# Patient Record
Sex: Female | Born: 1990 | Race: Black or African American | Hispanic: No | Marital: Single | Smoking: Current some day smoker
Health system: Southern US, Community
[De-identification: ages and names within clinical notes are randomized; demographics above are authoritative.]

## PROBLEM LIST (undated history)

## (undated) DIAGNOSIS — D649 Anemia, unspecified: Secondary | ICD-10-CM

---

## 2016-03-26 ENCOUNTER — Emergency Department (HOSPITAL_COMMUNITY): Payer: Self-pay

## 2016-03-26 ENCOUNTER — Emergency Department (HOSPITAL_COMMUNITY)
Admission: EM | Admit: 2016-03-26 | Discharge: 2016-03-26 | Disposition: A | Discharge status: Home / Self Care | Payer: Self-pay | Attending: Physician Assistant | Admitting: Physician Assistant

## 2016-03-26 ENCOUNTER — Encounter (HOSPITAL_COMMUNITY): Payer: Self-pay | Admitting: *Deleted

## 2016-03-26 DIAGNOSIS — F17290 Nicotine dependence, other tobacco product, uncomplicated: Secondary | ICD-10-CM | POA: Insufficient documentation

## 2016-03-26 DIAGNOSIS — R0789 Other chest pain: Secondary | ICD-10-CM | POA: Insufficient documentation

## 2016-03-26 HISTORY — DX: Anemia, unspecified: D64.9

## 2016-03-26 LAB — I-STAT TROPONIN, ED: TROPONIN I, POC: 0 ng/mL (ref 0.00–0.08)

## 2016-03-26 LAB — CBC
HCT: 33.8 % — ABNORMAL LOW (ref 36.0–46.0)
Hemoglobin: 10.2 g/dL — ABNORMAL LOW (ref 12.0–15.0)
MCH: 23.6 pg — ABNORMAL LOW (ref 26.0–34.0)
MCHC: 30.2 g/dL (ref 30.0–36.0)
MCV: 78.1 fL (ref 78.0–100.0)
PLATELETS: 329 10*3/uL (ref 150–400)
RBC: 4.33 MIL/uL (ref 3.87–5.11)
RDW: 15.9 % — AB (ref 11.5–15.5)
WBC: 5.5 10*3/uL (ref 4.0–10.5)

## 2016-03-26 LAB — BASIC METABOLIC PANEL
Anion gap: 8 (ref 5–15)
BUN: 7 mg/dL (ref 6–20)
CHLORIDE: 106 mmol/L (ref 101–111)
CO2: 26 mmol/L (ref 22–32)
CREATININE: 0.55 mg/dL (ref 0.44–1.00)
Calcium: 9.7 mg/dL (ref 8.9–10.3)
Glucose, Bld: 100 mg/dL — ABNORMAL HIGH (ref 65–99)
Potassium: 4.6 mmol/L (ref 3.5–5.1)
SODIUM: 140 mmol/L (ref 135–145)

## 2016-03-26 LAB — I-STAT BETA HCG BLOOD, ED (MC, WL, AP ONLY)

## 2016-03-26 MED ORDER — KETOROLAC TROMETHAMINE 60 MG/2ML IM SOLN
60.0000 mg | Freq: Once | INTRAMUSCULAR | Status: AC
  Administered 2016-03-26: 60 mg via INTRAMUSCULAR
  Filled 2016-03-26: qty 2

## 2016-03-26 MED ORDER — IBUPROFEN 800 MG PO TABS: 800.0000 mg | ORAL_TABLET | Freq: Three times a day (TID) | ORAL | 0 refills | Status: DC | PRN

## 2016-03-26 MED ORDER — KETOROLAC TROMETHAMINE 30 MG/ML IJ SOLN: 30.0000 mg | Freq: Once | INTRAMUSCULAR | Status: DC

## 2016-03-26 NOTE — ED Triage Notes (Signed)
Pt reports mid chest pains x 2 week. ekg done and no acute distress noted at triage.

## 2016-03-26 NOTE — ED Provider Notes (Signed)
[facility]EM[facility]ENCY DEPT Provider Note   CSN: [id number] Arrival date & time: 03/26/16  0945     History   Chief Complaint Chief Complaint  Patient presents with  . Chest Pain    HPI Amber Guerra S Krotzer is a 25 y.o. female.  HPI Patient presents to the emergency department with chest pain that started about 2 and after 3 weeks ago.  The patient states she has sharp chest pain that starts by her left sternum and goes underneath her left breast around to her left side.  Patient states that certain positions and movement and palpation make the pain worse.  She states she did not take any medications prior to arrival.  States nothing seemed to make her condition betterThe patient denies chest pain, shortness of breath, headache,blurred vision, neck pain, fever, cough, weakness, numbness, dizziness, anorexia, edema, abdominal pain, nausea, vomiting, diarrhea, rash, back pain, dysuria, hematemesis, bloody stool, near syncope, or syncope. The patient denies shortness of breath, headache,blurred vision, neck pain, fever, cough, weakness, numbness, dizziness, anorexia, edema, abdominal pain, nausea, vomiting, diarrhea, rash, back pain, dysuria, hematemesis, bloody stool, near syncope, or syncope. Past Medical History:  Diagnosis Date  . Anemia     There are no active problems to display for this patient.   History reviewed. No pertinent surgical history.  OB History    No data available       Home Medications    Prior to Admission medications   Not on File    Family History History reviewed. No pertinent family history.  Social History Social History  Substance Use Topics  . Smoking status: Current Some Day Smoker    Types: Cigars  . Smokeless tobacco: Not on file  . Alcohol use No     Allergies   Review of patient's allergies indicates no known allergies.   Review of Systems Review of Systems  All other systems negative except as documented in the HPI. All pertinent  positives and negatives as reviewed in the HPI. Physical Exam Updated Vital Signs BP 117/68   Pulse 63   Temp 98.6 F (37 C)   Resp 16   LMP 03/19/2016   SpO2 98%   Physical Exam  Constitutional: She is oriented to person, place, and time. She appears well-developed and well-nourished. No distress.  HENT:  Head: Normocephalic and atraumatic.  Mouth/Throat: Oropharynx is clear and moist.  Eyes: Pupils are equal, round, and reactive to light.  Neck: Normal range of motion. Neck supple.  Cardiovascular: Normal rate, regular rhythm and normal heart sounds.  Exam reveals no gallop and no friction rub.   No murmur heard. Pulmonary/Chest: Effort normal and breath sounds normal. No respiratory distress. She has no wheezes. She exhibits tenderness.  Abdominal: Soft. Bowel sounds are normal. She exhibits no distension. There is no tenderness.  Neurological: She is alert and oriented to person, place, and time. She exhibits normal muscle tone. Coordination normal.  Skin: Skin is warm and dry. No rash noted. No erythema.  Psychiatric: She has a normal mood and affect. Her behavior is normal.  Nursing note and vitals reviewed.    ED Treatments / Results  Labs (all labs ordered are listed, but only abnormal results are displayed) Labs Reviewed  BASIC METABOLIC PANEL - Abnormal; Notable for the following:       Result Value   Glucose, Bld 100 (*)    All other components within normal limits  CBC - Abnormal; Notable for the following:    Hemoglobin  10.2 (*)    HCT 33.8 (*)    MCH 23.6 (*)    RDW 15.9 (*)    All other components within normal limits  I-STAT TROPOININ, ED  I-STAT BETA HCG BLOOD, ED ([facility], [facility], [facility] ONLY)    EKG  EKG Interpretation None       Radiology Dg Chest 2 View  Result Date: 03/26/2016 CLINICAL DATA:  Chest pain EXAM: CHEST  2 VIEW COMPARISON:  Shortness of breath FINDINGS: The heart size and mediastinal contours are within normal limits. Both lungs are clear.  The visualized skeletal structures are unremarkable. IMPRESSION: No active cardiopulmonary disease. Electronically Signed   By: Elige Ko   On: 03/26/2016 10:45    Procedures Procedures (including critical care time)  Medications Ordered in ED Medications  ketorolac (TORADOL) injection 60 mg (60 mg Intramuscular Given 03/26/16 1319)     Initial Impression / Assessment and Plan / ED Course  I have reviewed the triage vital signs and the nursing notes.  Pertinent labs & imaging results that were available during my care of the patient were reviewed by me and considered in my medical decision making (see chart for details).  Clinical Course  Patient has pain consistent with chest wall irritation.  The patient is advised to return here as needed.  One troponin was drawn due to the fact the pain has been ongoing for almost 3 weeks.  Patient is advised of plan and all questions were answered  Final Clinical Impressions(s) / ED Diagnoses   Final diagnoses:  None    New Prescriptions New Prescriptions   No medications on file     Charlestine Night, PA-C 03/26/16 1400    Courteney Randall An, MD 03/29/16 [phone]

## 2016-03-26 NOTE — Discharge Instructions (Signed)
Return here as needed.  Follow-up with a primary care doctor or urgent care. Your testing here today did not show any signs or heart related issues or infections

## 2016-08-30 ENCOUNTER — Ambulatory Visit (HOSPITAL_COMMUNITY)
Admission: EM | Admit: 2016-08-30 | Discharge: 2016-08-30 | Disposition: A | Discharge status: Home / Self Care | Payer: Self-pay | Attending: Emergency Medicine | Admitting: Emergency Medicine

## 2016-08-30 ENCOUNTER — Encounter (HOSPITAL_COMMUNITY): Payer: Self-pay | Admitting: Emergency Medicine

## 2016-08-30 DIAGNOSIS — R51 Headache: Secondary | ICD-10-CM

## 2016-08-30 DIAGNOSIS — R519 Headache, unspecified: Secondary | ICD-10-CM

## 2016-08-30 DIAGNOSIS — B349 Viral infection, unspecified: Secondary | ICD-10-CM

## 2016-08-30 MED ORDER — DEXAMETHASONE 6 MG PO TABS: 6.0000 mg | ORAL_TABLET | Freq: Two times a day (BID) | ORAL | 0 refills | Status: DC

## 2016-08-30 MED ORDER — LOPERAMIDE HCL 2 MG PO CAPS: 2.0000 mg | ORAL_CAPSULE | Freq: Four times a day (QID) | ORAL | 0 refills | Status: DC | PRN

## 2016-08-30 MED ORDER — ONDANSETRON 4 MG PO TBDP: 4.0000 mg | ORAL_TABLET | Freq: Three times a day (TID) | ORAL | 0 refills | Status: DC | PRN

## 2016-08-30 MED ORDER — KETOROLAC TROMETHAMINE 10 MG PO TABS: 10.0000 mg | ORAL_TABLET | Freq: Four times a day (QID) | ORAL | 0 refills | Status: DC | PRN

## 2016-08-30 NOTE — ED Provider Notes (Signed)
CSN: [id number]     Arrival date & time 08/30/16  1757 History   First MD Initiated Contact with Patient 08/30/16 1925     Chief Complaint  Patient presents with  . Insomnia  . Generalized Body Aches   (Consider location/radiation/quality/duration/timing/severity/associated sxs/prior Treatment) 26 year old female presents to clinic for evaluation of a one week long illness. She is complaining of headache, , diarrhea, and some nausea. Symptoms started out as being flulike one week ago, with muscle aches, bodyaches, headache, fever, congestion, and cough. She states the majority of her symptoms have resolved except for headache, and for diarrhea. She has a past history of migraine, and states her current headache is similar to previous headaches. She describes having light sensitivity, and sound sensitivity, pain is described as a bilateral dull ache.  Review of systems she has no dizziness, weakness, numbness, ear pain or pressure, tinnitus, congestion, blurred vision, does have light sensitivity, denies any chest pain or tightness, no shortness of breath, wheezing, does have a cough, is nauseated, no vomiting, positive diarrhea, no abdominal pain, no dysuria, no vaginal discharge, last menstrual period 08/23/2016, denies any possibility of pregnancy, no swelling in her hands, feet, or ankles. She smokes Black and mild's daily, occasional alcoholic drink, denies any recreational narcotic use, family history reviewed and noncontributory.   The history is provided by the patient.  Insomnia     Past Medical History:  Diagnosis Date  . Anemia    History reviewed. No pertinent surgical history. No family history on file. Social History  Substance Use Topics  . Smoking status: Current Some Day Smoker    Types: Cigars  . Smokeless tobacco: Current User  . Alcohol use No   OB History    No data available     Review of Systems  Reason unable to perform ROS: as covered in HPI.   Psychiatric/Behavioral: The patient has insomnia.   All other systems reviewed and are negative.   Allergies  Patient has no known allergies.  Home Medications   Prior to Admission medications   Medication Sig Start Date End Date Taking? Authorizing Provider  dexamethasone (DECADRON) 6 MG tablet Take 1 tablet (6 mg total) by mouth 2 (two) times daily with a meal. 08/30/16   Dorena Bodo, NP  ketorolac (TORADOL) 10 MG tablet Take 1 tablet (10 mg total) by mouth every 6 (six) hours as needed. 08/30/16   Dorena Bodo, NP  loperamide (IMODIUM) 2 MG capsule Take 1 capsule (2 mg total) by mouth 4 (four) times daily as needed for diarrhea or loose stools. 08/30/16   Dorena Bodo, NP  ondansetron (ZOFRAN ODT) 4 MG disintegrating tablet Take 1 tablet (4 mg total) by mouth every 8 (eight) hours as needed for nausea or vomiting. 08/30/16   Dorena Bodo, NP   Meds Ordered and Administered this Visit  Medications - No data to display  BP 124/78 (BP Location: Right Arm)   Pulse 73   Temp 98.2 F (36.8 C) (Oral)   Resp 17   Ht 5\' 7"  (1.702 m)   Wt 240 lb (108.9 kg)   LMP 08/23/2016   SpO2 100%   BMI 37.59 kg/m  No data found.   Physical Exam  Constitutional: She is oriented to person, place, and time. She appears well-developed and well-nourished. She does not have a sickly appearance. She does not appear ill. No distress.  HENT:  Head: Normocephalic and atraumatic.  Right Ear: Tympanic membrane and external ear normal.  Left  Ear: Tympanic membrane and external ear normal.  Nose: Nose normal. Right sinus exhibits no maxillary sinus tenderness and no frontal sinus tenderness. Left sinus exhibits no maxillary sinus tenderness and no frontal sinus tenderness.  Mouth/Throat: Uvula is midline and oropharynx is clear and moist. No oropharyngeal exudate.  Eyes: EOM are normal. Pupils are equal, round, and reactive to light.  Neck: Normal range of motion. Neck supple. No JVD present.   Cardiovascular: Normal rate and regular rhythm.   Pulmonary/Chest: Effort normal and breath sounds normal. No respiratory distress. She has no wheezes.  Abdominal: Soft. Bowel sounds are normal. She exhibits no distension. There is no tenderness. There is no guarding.  Musculoskeletal: She exhibits no edema or tenderness.  Lymphadenopathy:    She has no cervical adenopathy.  Neurological: She is alert and oriented to person, place, and time. No cranial nerve deficit.  Skin: Skin is warm and dry. Capillary refill takes less than 2 seconds. She is not diaphoretic.  Psychiatric: She has a normal mood and affect. Her behavior is normal.  Nursing note and vitals reviewed.   Urgent Care Course     Procedures (including critical care time)  Labs Review Labs Reviewed - No data to display  Imaging Review No results found.     MDM   1. Viral illness   2. Acute nonintractable headache, unspecified headache type     You most likely have a viral illness. I have prescribed loperamide for your diarrhea, take 1 tablet after each loose stool, not to exceed four tablets in any 24 hour period.   For your headache, I have prescribed Toradol, take 1 tablet every 6 hours as needed for pain. Additionally I have prescribed a steroid called Decadron, 1 tablet twice a day for 2 days. For Nausea, I have prescribed Zofran, take 1 tablet under the tongue every 8 hours as needed. Finally, take over the counter benadryl, 2 tablets or 50 mg every 6 hours not to exceed 300 mg a day.  Should your symptoms fail to resolve, follow up with primary care provider or return to clinic as needed.      Dorena BodoLawrence Ebonye Reade, NP 08/30/16 1950

## 2016-08-30 NOTE — ED Triage Notes (Signed)
Pt. Stated, I've not been able to sleep and my body has been achy for a week.

## 2016-08-30 NOTE — Discharge Instructions (Signed)
You most likely have a viral illness. I have prescribed loperamide for your diarrhea, take 1 tablet after each loose stool, not to exceed four tablets in any 24 hour period.   For your headache, I have prescribed Toradol, take 1 tablet every 6 hours as needed for pain. Additionally I have prescribed a steroid called Decadron, 1 tablet twice a day for 2 days. For Nausea, I have prescribed Zofran, take 1 tablet under the tongue every 8 hours as needed. Finally, take over the counter benadryl, 2 tablets or 50 mg every 6 hours not to exceed 300 mg a day.  Should your symptoms fail to resolve, follow up with primary care provider or return to clinic as needed.

## 2016-09-10 ENCOUNTER — Emergency Department (HOSPITAL_COMMUNITY)
Admission: EM | Admit: 2016-09-10 | Discharge: 2016-09-10 | Disposition: A | Discharge status: Home / Self Care | Payer: Self-pay | Attending: Emergency Medicine | Admitting: Emergency Medicine

## 2016-09-10 ENCOUNTER — Encounter (HOSPITAL_COMMUNITY): Payer: Self-pay | Admitting: Emergency Medicine

## 2016-09-10 DIAGNOSIS — G4700 Insomnia, unspecified: Secondary | ICD-10-CM | POA: Insufficient documentation

## 2016-09-10 DIAGNOSIS — F17290 Nicotine dependence, other tobacco product, uncomplicated: Secondary | ICD-10-CM | POA: Insufficient documentation

## 2016-09-10 DIAGNOSIS — Z79899 Other long term (current) drug therapy: Secondary | ICD-10-CM | POA: Insufficient documentation

## 2016-09-10 NOTE — ED Triage Notes (Signed)
Pt states she has been having difficulty sleeping for two weeks. Pt's hours changed at work and she is having to wake up earlier than normal. Pt has been having some diarrhea off and on as well.

## 2016-09-10 NOTE — Discharge Instructions (Signed)
Recommend to try melatonin +/- chamomile tea (goodnight time tea at grocery store).  If this doesn't work, may wish to try unisom-- this can cause some morning drowsiness so make sure you have a full night to sleep. Follow-up with the wellness clinic-- call to make appt. Return to the ED for new or worsening symptoms.

## 2016-09-10 NOTE — ED Provider Notes (Signed)
[facility] Provider Note   CSN: [id number] Arrival date & time: 09/10/16  0707     History   Chief Complaint Chief Complaint  Patient presents with  . Insomnia    HPI Amber Guerra is a 26 y.o. female.  The history is provided by the patient and medical records.    [age] year old female here with two-week history of insomnia. Patient reports her work schedule recently changed, now she goes in an hour earlier and is having difficulty going to sleep. States when she gets home in the afternoon she will sleep for about an hour or so, but awake shortly after and cannot go back to sleep until 1-2am and has to be back at work at 5 AM.  States she was seen at urgent care for likely viral illness about a week ago and was prescribed some medications, but states she did not get them filled as "she does not like taking medicine". States she did try taking some Benadryl for sleep, reports this made her drowsy but did not help her sleep at all. She has tried some Tylenol PM as well. States she has been getting frequent headaches but thinks it is because she is not able to rest. She's not had any dizziness, weakness, confusion, changes in speech, or visual disturbance. She's not had any falls or head trauma. Not currently on anticoagulation. Patient states she just feels she is getting anxious and she knows her body needs to rest. Reports she has had some intermittent diarrhea, has greatly improved from last week. No abdominal pain, nausea, or vomiting. No fever or chills.  Past Medical History:  Diagnosis Date  . Anemia     There are no active problems to display for this patient.   History reviewed. No pertinent surgical history.  OB History    No data available       Home Medications    Prior to Admission medications   Medication Sig Start Date End Date Taking? Authorizing Provider  dexamethasone (DECADRON) 6 MG tablet Take 1 tablet (6 mg total) by mouth 2 (two) times daily with  a meal. 08/30/16   Dorena BodoLawrence Kennard, NP  ketorolac (TORADOL) 10 MG tablet Take 1 tablet (10 mg total) by mouth every 6 (six) hours as needed. 08/30/16   Dorena BodoLawrence Kennard, NP  loperamide (IMODIUM) 2 MG capsule Take 1 capsule (2 mg total) by mouth 4 (four) times daily as needed for diarrhea or loose stools. 08/30/16   Dorena BodoLawrence Kennard, NP  ondansetron (ZOFRAN ODT) 4 MG disintegrating tablet Take 1 tablet (4 mg total) by mouth every 8 (eight) hours as needed for nausea or vomiting. 08/30/16   Dorena BodoLawrence Kennard, NP    Family History History reviewed. No pertinent family history.  Social History Social History  Substance Use Topics  . Smoking status: Current Some Day Smoker    Types: Cigars  . Smokeless tobacco: Current User  . Alcohol use No     Allergies   Patient has no known allergies.   Review of Systems Review of Systems  Constitutional:       Insomnia  All other systems reviewed and are negative.    Physical Exam Updated Vital Signs BP 125/79 (BP Location: Left Arm)   Pulse 88   Temp 98.7 F (37.1 C) (Oral)   Resp 15   LMP 09/06/2016   SpO2 100%   Physical Exam  Constitutional: She is oriented to person, place, and time. She appears well-developed and well-nourished. No distress.  HENT:  Head: Normocephalic and atraumatic.  Right Ear: External ear normal.  Left Ear: External ear normal.  Mouth/Throat: Oropharynx is clear and moist.  Eyes: Conjunctivae and EOM are normal. Pupils are equal, round, and reactive to light.  Neck: Normal range of motion and full passive range of motion without pain. Neck supple. No neck rigidity.  No rigidity, no meningismus  Cardiovascular: Normal rate, regular rhythm and normal heart sounds.   No murmur heard. Pulmonary/Chest: Effort normal and breath sounds normal. No respiratory distress. She has no wheezes. She has no rhonchi.  Abdominal: Soft. Bowel sounds are normal. There is no tenderness. There is no guarding.  Musculoskeletal:  Normal range of motion. She exhibits no edema.  Neurological: She is alert and oriented to person, place, and time. She has normal strength. She displays no tremor. No cranial nerve deficit or sensory deficit. She displays no seizure activity.  AAOx3, answering questions and following commands appropriately; equal strength UE and LE bilaterally; CN grossly intact; moves all extremities appropriately without ataxia; no focal neuro deficits or facial asymmetry appreciated  Skin: Skin is warm and dry. No rash noted. She is not diaphoretic.  Psychiatric: She has a normal mood and affect. Her behavior is normal. Thought content normal.  Nursing note and vitals reviewed.    ED Treatments / Results  Labs (all labs ordered are listed, but only abnormal results are displayed) Labs Reviewed - No data to display  EKG  EKG Interpretation None       Radiology No results found.  Procedures Procedures (including critical care time)  Medications Ordered in ED Medications - No data to display   Initial Impression / Assessment and Plan / ED Course  I have reviewed the triage vital signs and the nursing notes.  Pertinent labs & imaging results that were available during my care of the patient were reviewed by me and considered in my medical decision making (see chart for details).  26 year old female here with two-week history of insomnia. Reports recent schedule change at work. Viral illness last week as well. She is afebrile and nontoxic. Her exam is overall benign. She has no focal neurologic deficits. No signs or symptoms concerning for meningitis. Her abdomen is soft and nontender.  Vital signs are stable. She does admit to some increased stress which may be contributing.  Suspect her headaches may be secondary to lack of sleep and increased stress. I have offered her Toradol injection here for headache which she declined. We discussed options for natural treatment of insomnia including  melatonin and/or chamomile nighttime tea. Discussed secondary plan if these do not work including over-the-counter Unisom. She was advised of potential drowsy effects from the Unisom. She does not currently have a primary care doctor, will refer to the wellness clinic for follow-up if she continues having ongoing issues.  Discussed plan with patient, she acknowledged understanding and agreed with plan of care.  Return precautions given for new or worsening symptoms.  Final Clinical Impressions(s) / ED Diagnoses   Final diagnoses:  Insomnia, unspecified type    New Prescriptions New Prescriptions   No medications on file     Garlon Hatchet, Cordelia Poche 09/10/16 1030    Alvira Monday, MD 09/13/16 2246

## 2016-09-10 NOTE — ED Notes (Signed)
Pt states she understands instruction. Home stable with steady gait. 

## 2016-09-11 ENCOUNTER — Ambulatory Visit (INDEPENDENT_AMBULATORY_CARE_PROVIDER_SITE_OTHER): Payer: Self-pay

## 2016-09-11 ENCOUNTER — Ambulatory Visit (HOSPITAL_COMMUNITY)
Admission: EM | Admit: 2016-09-11 | Discharge: 2016-09-11 | Disposition: A | Discharge status: Home / Self Care | Payer: Self-pay | Attending: Internal Medicine | Admitting: Internal Medicine

## 2016-09-11 ENCOUNTER — Encounter (HOSPITAL_COMMUNITY): Payer: Self-pay | Admitting: Emergency Medicine

## 2016-09-11 DIAGNOSIS — M542 Cervicalgia: Secondary | ICD-10-CM

## 2016-09-11 DIAGNOSIS — M79602 Pain in left arm: Secondary | ICD-10-CM

## 2016-09-11 DIAGNOSIS — M545 Low back pain: Secondary | ICD-10-CM

## 2016-09-11 MED ORDER — KETOROLAC TROMETHAMINE 60 MG/2ML IM SOLN
60.0000 mg | Freq: Once | INTRAMUSCULAR | Status: AC
  Administered 2016-09-11: 60 mg via INTRAMUSCULAR

## 2016-09-11 MED ORDER — CYCLOBENZAPRINE HCL 10 MG PO TABS: 10.0000 mg | ORAL_TABLET | Freq: Three times a day (TID) | ORAL | 0 refills | Status: AC | PRN

## 2016-09-11 MED ORDER — KETOROLAC TROMETHAMINE 60 MG/2ML IM SOLN
INTRAMUSCULAR | Status: AC
  Filled 2016-09-11: qty 2

## 2016-09-11 MED ORDER — TRAMADOL HCL 50 MG PO TABS: 50.0000 mg | ORAL_TABLET | Freq: Four times a day (QID) | ORAL | 0 refills | Status: DC | PRN
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
## 2016-09-30 ENCOUNTER — Encounter (HOSPITAL_COMMUNITY): Payer: Self-pay | Admitting: Emergency Medicine

## 2016-09-30 ENCOUNTER — Ambulatory Visit (HOSPITAL_COMMUNITY)
Admission: EM | Admit: 2016-09-30 | Discharge: 2016-09-30 | Disposition: A | Discharge status: Home / Self Care | Payer: Self-pay | Attending: Family Medicine | Admitting: Family Medicine

## 2016-09-30 DIAGNOSIS — F5101 Primary insomnia: Secondary | ICD-10-CM

## 2016-09-30 MED ORDER — ZOLPIDEM TARTRATE 5 MG PO TABS: 5.0000 mg | ORAL_TABLET | Freq: Every evening | ORAL | 0 refills | Status: DC | PRN
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
## 2016-09-30 NOTE — ED Triage Notes (Signed)
Here for constant HA onset 3 days associated w/unable to fall asleep  Taking OTC BC powers w/no relief  A&O x4... AND

## 2016-09-30 NOTE — Discharge Instructions (Signed)
Consider taking a shower before going to bed. The medicine I prescribed only last a few hours and it's used to help get to sleep.  You may need a longer acting medicine and also works for anxiety. Once this drug is Remeron (mirtazepine)
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
## 2018-05-21 ENCOUNTER — Encounter (HOSPITAL_COMMUNITY): Payer: Self-pay | Admitting: Emergency Medicine

## 2018-05-21 ENCOUNTER — Other Ambulatory Visit: Payer: Self-pay

## 2018-05-21 ENCOUNTER — Ambulatory Visit (HOSPITAL_COMMUNITY)
Admission: EM | Admit: 2018-05-21 | Discharge: 2018-05-21 | Disposition: A | Discharge status: Home / Self Care | Payer: Self-pay | Attending: Family Medicine | Admitting: Family Medicine

## 2018-05-21 DIAGNOSIS — M25511 Pain in right shoulder: Secondary | ICD-10-CM

## 2018-05-21 DIAGNOSIS — M654 Radial styloid tenosynovitis [de Quervain]: Secondary | ICD-10-CM

## 2018-05-21 MED ORDER — PREDNISONE 50 MG PO TABS: ORAL_TABLET | ORAL | 0 refills | Status: DC

## 2018-05-21 NOTE — ED Provider Notes (Signed)
[facility]    CSN: [id number] Arrival date & time: 05/21/18  1203     History   Chief Complaint Chief Complaint  Patient presents with  . Shoulder Pain    HPI Amber Guerra is a 27 y.o. female.   Established UC patient  The patient presented to the [facility] with a complaint of right shoulder pain that started 3 days ago. The patient denied any specific injury but did report that she has been doing lifting this weekend that she does not normally do.  She first noticed discomfort in the right radial thumb and extends across the wrist joint.  She then started feeling pain in her shoulder and trapezius areas on the right.  Patient is right-hand dominant.  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     Past Medical History:  Diagnosis Date  . Anemia     There are no active problems to display for this patient.   History reviewed. No pertinent surgical history.  OB History   None      Home Medications    Prior to Admission medications   Medication Sig Start Date End Date Taking? Authorizing Provider  predniSONE (DELTASONE) 50 MG tablet One daily with food 05/21/18   Elvina Sidle, MD    Family History History reviewed. No pertinent family history.  Social History Social History   Tobacco Use  . Smoking status: Current Some Day Smoker    Types: Cigars  . Smokeless tobacco: Current User  Substance Use Topics  . Alcohol use: No  . Drug use: Yes    Types: Marijuana     Allergies   Patient has no known allergies.   Review of Systems Review of Systems   Physical Exam Triage Vital Signs ED Triage Vitals  Enc Vitals Group     BP 05/21/18 1232 128/83     Pulse Rate 05/21/18 1232 88     Resp 05/21/18 1232 18     Temp 05/21/18 1232 98.1 F (36.7 C)     Temp Source 05/21/18 1232 Oral     SpO2 05/21/18 1232 100 %     Weight --      Height --      Head Circumference --      Peak Flow --      Pain  Score 05/21/18 1230 7     Pain Loc --      Pain Edu? --      Excl. in GC? --    No data found.  Updated Vital Signs BP 128/83 (BP Location: Left Arm)   Pulse 88   Temp 98.1 F (36.7 C) (Oral)   Resp 18   LMP 04/21/2018   SpO2 100%   Visual Acuity Right Eye Distance:   Left Eye Distance:   Bilateral Distance:    Right Eye Near:   Left Eye Near:    Bilateral Near:     Physical Exam  Constitutional: She is oriented to person, place, and time. She appears well-developed and well-nourished.  HENT:  Right Ear: External ear normal.  Left Ear: External ear normal.  Eyes: Pupils are equal, round, and reactive to light. Conjunctivae are normal.  Neck: Normal range of motion. Neck supple.  Pulmonary/Chest: Effort normal.  Musculoskeletal: She exhibits tenderness.  Tender right shoulder diffusely  Negative Neer's test.  Tender right trapezius  Tender right radius and proximal right thumb  Neurological: She is alert and  oriented to person, place, and time.  Skin: Skin is warm and dry.  Psychiatric: She has a normal mood and affect.  Nursing note and vitals reviewed.    UC Treatments / Results  Labs (all labs ordered are listed, but only abnormal results are displayed) Labs Reviewed - No data to display  EKG None  Radiology No results found.  Procedures Procedures (including critical care time)  Medications Ordered in UC Medications - No data to display  Initial Impression / Assessment and Plan / UC Course  I have reviewed the triage vital signs and the nursing notes.  Pertinent labs & imaging results that were available during my care of the patient were reviewed by me and considered in my medical decision making (see chart for details).    Final Clinical Impressions(s) / UC Diagnoses   Final diagnoses:  Acute pain of right shoulder  Tenosynovitis of radial styloid     Discharge Instructions     If pain perists, return or call the hand specialist  below.    ED Prescriptions    Medication Sig Dispense Auth. Provider   predniSONE (DELTASONE) 50 MG tablet One daily with food 5 tablet Elvina SidleLauenstein, Jailyne Chieffo, MD     Controlled Substance Prescriptions [place] Controlled Substance Registry consulted? Not Applicable   Elvina SidleLauenstein, Chantele Corado, MD 05/21/18 1253

## 2018-05-21 NOTE — Discharge Instructions (Addendum)
If pain perists, return or call the hand specialist below.

## 2018-05-21 NOTE — ED Triage Notes (Signed)
[facility] a complaint of right shoulder pain that started 3 days ago. The patient denied any specific injury but did report that she has been doing lifting this weekend that she does not normally do.

## 2020-12-31 ENCOUNTER — Other Ambulatory Visit: Payer: Self-pay

## 2020-12-31 ENCOUNTER — Ambulatory Visit (HOSPITAL_COMMUNITY)
Admission: EM | Admit: 2020-12-31 | Discharge: 2020-12-31 | Disposition: A | Discharge status: Home / Self Care | Payer: Self-pay | Attending: Family Medicine | Admitting: Family Medicine

## 2020-12-31 ENCOUNTER — Encounter (HOSPITAL_COMMUNITY): Payer: Self-pay | Admitting: Emergency Medicine

## 2020-12-31 DIAGNOSIS — R079 Chest pain, unspecified: Secondary | ICD-10-CM

## 2020-12-31 DIAGNOSIS — B354 Tinea corporis: Secondary | ICD-10-CM

## 2020-12-31 DIAGNOSIS — M940 Chondrocostal junction syndrome [Tietze]: Secondary | ICD-10-CM

## 2020-12-31 MED ORDER — NAPROXEN 500 MG PO TABS: 500.0000 mg | ORAL_TABLET | Freq: Two times a day (BID) | ORAL | 0 refills | Status: DC | PRN

## 2020-12-31 MED ORDER — KETOCONAZOLE 2 % EX CREA: 1.0000 "application " | TOPICAL_CREAM | Freq: Two times a day (BID) | CUTANEOUS | 0 refills | Status: DC | PRN

## 2020-12-31 NOTE — ED Provider Notes (Signed)
[facility]    CSN: [id number] Arrival date & time: 12/31/20  8563      History   Chief Complaint No chief complaint on file.   HPI Amber Guerra is a 30 y.o. female.   Patient presenting today with several months of ongoing, intermittent sharp chest pain that is in the sternal region and sometimes wraps around the left anterior and lateral rib region.  She denies palpitations, shortness of breath, chest tightness, injury to the area, history of cardiac issues.  Direct pressure and stress seems to make the pain worse.  Not trying anything over-the-counter for symptoms.  She also noticed the past few days a rash that is mildly itchy between her breasts.  No new products used or outdoor exposures that she is aware of.  Not trying anything over-the-counter for symptoms.   Past Medical History:  Diagnosis Date   Anemia     There are no problems to display for this patient.   History reviewed. No pertinent surgical history.  OB History   No obstetric history on file.      Home Medications    Prior to Admission medications   Medication Sig Start Date End Date Taking? Authorizing Provider  ketoconazole (NIZORAL) 2 % cream Apply 1 application topically 2 (two) times daily as needed for irritation. 12/31/20  Yes Particia Nearing, PA-C  naproxen (NAPROSYN) 500 MG tablet Take 1 tablet (500 mg total) by mouth 2 (two) times daily as needed. 12/31/20  Yes Particia Nearing, PA-C  predniSONE (DELTASONE) 50 MG tablet One daily with food Patient not taking: Reported on 12/31/2020 05/21/18   Elvina Sidle, MD    Family History History reviewed. No pertinent family history.  Social History Social History   Tobacco Use   Smoking status: Some Days    Pack years: 0.00    Types: Cigars   Smokeless tobacco: Never  Vaping Use   Vaping Use: Former  Substance Use Topics   Alcohol use: No   Drug use: Yes    Types: Marijuana     Allergies   Patient has no  known allergies.   Review of Systems Review of Systems Per HPI  Physical Exam Triage Vital Signs ED Triage Vitals  Enc Vitals Group     BP 12/31/20 1014 (!) 155/96     Pulse Rate 12/31/20 1014 73     Resp 12/31/20 1014 20     Temp 12/31/20 1014 99 F (37.2 C)     Temp Source 12/31/20 1014 Oral     SpO2 12/31/20 1014 100 %     Weight --      Height --      Head Circumference --      Peak Flow --      Pain Score 12/31/20 1009 6     Pain Loc --      Pain Edu? --      Excl. in GC? --    No data found.  Updated Vital Signs BP (!) 155/96 (BP Location: Right Arm)   Pulse 73   Temp 99 F (37.2 C) (Oral)   Resp 20   LMP 12/22/2020   SpO2 100%   Visual Acuity Right Eye Distance:   Left Eye Distance:   Bilateral Distance:    Right Eye Near:   Left Eye Near:    Bilateral Near:     Physical Exam Vitals and nursing note reviewed.  Constitutional:      Appearance:  Normal appearance. She is not ill-appearing.  HENT:     Head: Atraumatic.  Eyes:     Extraocular Movements: Extraocular movements intact.     Conjunctiva/sclera: Conjunctivae normal.  Cardiovascular:     Rate and Rhythm: Normal rate and regular rhythm.     Heart sounds: Normal heart sounds.  Pulmonary:     Effort: Pulmonary effort is normal.     Breath sounds: Normal breath sounds.     Comments: Chest wall tenderness palpation reproducing the pain that she is feeling when palpating the sternal region and anterior left rib region Chest:     Chest wall: Tenderness present.  Musculoskeletal:        General: Normal range of motion.     Cervical back: Normal range of motion and neck supple.  Skin:    General: Skin is warm and dry.     Findings: Rash present.     Comments: Raised, mildly hyperpigmented peeling rash between breasts near base of sternum  Neurological:     Mental Status: She is alert and oriented to person, place, and time.  Psychiatric:        Mood and Affect: Mood normal.        Thought  Content: Thought content normal.        Judgment: Judgment normal.     UC Treatments / Results  Labs (all labs ordered are listed, but only abnormal results are displayed) Labs Reviewed - No data to display  EKG   Radiology No results found.  Procedures Procedures (including critical care time)  Medications Ordered in UC Medications - No data to display  Initial Impression / Assessment and Plan / UC Course  I have reviewed the triage vital signs and the nursing notes.  Pertinent labs & imaging results that were available during my care of the patient were reviewed by me and considered in my medical decision making (see chart for details).     Mildly hypertensive but otherwise vital signs very reassuring and within normal limits today.  EKG showing sinus bradycardia but otherwise no ST or T wave changes.  Exam overall reassuring and patient appears in no distress.  Her chest pain is reproducible, chronic, intermittent-suspect costochondritis/musculoskeletal in nature.  Will trial naproxen, heat, lidocaine patches and follow-up with primary care for recheck.  Suspect her rash on her chest is fungal, will treat with ketoconazole cream, moisturizers, tea tree oil and again follow-up with PCP if not fully resolving for recheck.  Return for acutely worsening symptoms at any time.  Final Clinical Impressions(s) / UC Diagnoses   Final diagnoses:  Tinea corporis  Chest pain, unspecified type  Costochondritis   Discharge Instructions   None    ED Prescriptions     Medication Sig Dispense Auth. Provider   naproxen (NAPROSYN) 500 MG tablet Take 1 tablet (500 mg total) by mouth 2 (two) times daily as needed. 30 tablet Particia Nearing, [place]   ketoconazole (NIZORAL) 2 % cream Apply 1 application topically 2 (two) times daily as needed for irritation. 30 g Particia Nearing, [place]      PDMP not reviewed this encounter.   Particia Nearing, [place] 12/31/20 1217

## 2020-12-31 NOTE — ED Notes (Signed)
EKG equipment unavailable

## 2020-12-31 NOTE — ED Triage Notes (Signed)
Intermittent chest pain started a couple of months ago.   Discolored skin on center chest for 2 days.

## 2022-03-10 ENCOUNTER — Encounter (HOSPITAL_COMMUNITY): Payer: Self-pay

## 2022-03-10 ENCOUNTER — Ambulatory Visit (HOSPITAL_COMMUNITY)
Admission: EM | Admit: 2022-03-10 | Discharge: 2022-03-10 | Disposition: A | Discharge status: Home / Self Care | Payer: Self-pay | Attending: Family | Admitting: Family

## 2022-03-10 DIAGNOSIS — R519 Headache, unspecified: Secondary | ICD-10-CM

## 2022-03-10 DIAGNOSIS — H5711 Ocular pain, right eye: Secondary | ICD-10-CM

## 2022-03-10 DIAGNOSIS — H53143 Visual discomfort, bilateral: Secondary | ICD-10-CM

## 2022-03-10 MED ORDER — NAPROXEN 500 MG PO TABS: 500.0000 mg | ORAL_TABLET | Freq: Two times a day (BID) | ORAL | 0 refills | Status: DC | PRN

## 2022-03-10 MED ORDER — KETOROLAC TROMETHAMINE 30 MG/ML IJ SOLN
30.0000 mg | Freq: Once | INTRAMUSCULAR | Status: AC
  Administered 2022-03-10: 30 mg via INTRAMUSCULAR

## 2022-03-10 MED ORDER — KETOROLAC TROMETHAMINE 30 MG/ML IJ SOLN
INTRAMUSCULAR | Status: AC
  Filled 2022-03-10: qty 1

## 2022-03-10 NOTE — ED Triage Notes (Signed)
Patient with c/o right eye pain more behind the eye. States she is also having headaches and light sensitivity x2 weeks.

## 2022-03-10 NOTE — ED Provider Notes (Signed)
[place]    CSN: [id number] Arrival date & time: 03/10/22  0941      History   Chief Complaint Chief Complaint  Patient presents with   Eye Pain    HPI Amber Guerra is a 31 y.o. female.   31 year old female accompanied by her significant other presents with pain behind her right eye, then headache and photophobia for the past 2 weeks. Pain is fairly constant. Pounding pain on right side. Denies any fever, runny nose, sore throat, dizziness, nausea or vomiting. No distinct trauma to eye/head.  Having difficulty sleeping due to pain. Has used OTC clear eye eyedrops with no relief.  Has taken Advil with some relief.  No previous similar episodes.  Patient does not currently wear glasses or contacts.  However patient previously was prescribed glasses and eyedrops by Eye doctor.  No other chronic health issues except anemia.  Does smoke tobacco and use marijuana occasionally. Takes no daily medication.  The history is provided by the patient.   Past Medical History:  Diagnosis Date   Anemia     There are no problems to display for this patient.   History reviewed. No pertinent surgical history.  OB History   No obstetric history on file.      Home Medications    Prior to Admission medications   Medication Sig Start Date End Date Taking? Authorizing Provider  naproxen (NAPROSYN) 500 MG tablet Take 1 tablet (500 mg total) by mouth every 12 (twelve) hours as needed for moderate pain or headache. 03/10/22  Yes Treva Huyett, Nicholes Stairs, NP    Family History No family history on file.  Social History Social History   Tobacco Use   Smoking status: Some Days    Types: Cigars   Smokeless tobacco: Never  Vaping Use   Vaping Use: Former  Substance Use Topics   Alcohol use: No   Drug use: Yes    Types: Marijuana     Allergies   Patient has no known allergies.   Review of Systems Review of Systems  Constitutional:  Positive for fatigue. Negative for  appetite change, chills, diaphoresis and fever.  HENT:  Negative for congestion, ear pain, hearing loss, mouth sores, nosebleeds, postnasal drip, rhinorrhea, sinus pressure, sinus pain, sore throat, tinnitus and trouble swallowing.   Eyes:  Positive for photophobia and pain (behind right eye only). Negative for discharge, redness and itching.  Respiratory:  Negative for cough, chest tightness and shortness of breath.   Gastrointestinal:  Negative for nausea and vomiting.  Musculoskeletal:  Negative for arthralgias, myalgias, neck pain and neck stiffness.  Skin:  Negative for color change and rash.  Allergic/Immunologic: Negative for environmental allergies, food allergies and immunocompromised state.  Neurological:  Positive for light-headedness and headaches. Negative for dizziness, tremors, seizures, syncope, facial asymmetry, speech difficulty, weakness and numbness.  Hematological:  Negative for adenopathy. Does not bruise/bleed easily.  Psychiatric/Behavioral:  Positive for sleep disturbance.      Physical Exam Triage Vital Signs ED Triage Vitals  Enc Vitals Group     BP 03/10/22 1101 (!) 136/90     Pulse Rate 03/10/22 1101 61     Resp 03/10/22 1101 18     Temp 03/10/22 1101 98.8 F (37.1 C)     Temp Source 03/10/22 1101 Oral     SpO2 03/10/22 1101 100 %     Weight --      Height --      Head Circumference --  Peak Flow --      Pain Score 03/10/22 1103 6     Pain Loc --      Pain Edu? --      Excl. in [place]? --    No data found.  Updated Vital Signs BP (!) 136/90 (BP Location: Right Arm)   Pulse 61   Temp 98.8 F (37.1 C) (Oral)   Resp 18   LMP 03/06/2022 (Approximate)   SpO2 100%   Visual Acuity Right Eye Distance: 20/15 Left Eye Distance: 20/15 Bilateral Distance: 20/15  Right Eye Near:   Left Eye Near:    Bilateral Near:     Physical Exam Vitals and nursing note reviewed.  Constitutional:      General: She is awake. She is not in acute distress.     Appearance: She is well-developed and well-groomed.     Comments: She is sitting comfortably on the exam table in no acute distress but appears tired and in pain.   HENT:     Head: Normocephalic and atraumatic. No masses, right periorbital erythema or left periorbital erythema. Hair is normal.     Jaw: There is normal jaw occlusion.      Comments: Slightly tender along right eye orbit and right frontal and temporal area. No distinct swelling, rash or redness.     Right Ear: Hearing, tympanic membrane, ear canal and external ear normal.     Left Ear: Hearing, tympanic membrane, ear canal and external ear normal.     Nose: Nose normal.     Right Sinus: No maxillary sinus tenderness.     Left Sinus: No maxillary sinus tenderness.     Mouth/Throat:     Lips: Pink.     Mouth: Mucous membranes are moist.     Pharynx: Oropharynx is clear. Uvula midline.  Eyes:     General: Lids are normal. Vision grossly intact. Gaze aligned appropriately. No visual field deficit or scleral icterus.       Right eye: No discharge.        Left eye: No discharge.     Extraocular Movements: Extraocular movements intact.     Conjunctiva/sclera: Conjunctivae normal.     Pupils: Pupils are equal, round, and reactive to light.     Comments: Patient sensitive to light both eyes R>L.  Vision checked in exam room (around 10 feet)- did notice decrease in vision of left eye compared to right.    Neck:     Vascular: No carotid bruit.  Cardiovascular:     Rate and Rhythm: Normal rate and regular rhythm.     Heart sounds: Normal heart sounds. No murmur heard. Pulmonary:     Effort: Pulmonary effort is normal.     Breath sounds: Normal breath sounds.  Musculoskeletal:        General: Normal range of motion.     Cervical back: Normal range of motion and neck supple. No rigidity or tenderness.  Lymphadenopathy:     Cervical: No cervical adenopathy.  Skin:    General: Skin is warm and dry.     Capillary Refill:  Capillary refill takes less than 2 seconds.     Findings: No rash.  Neurological:     General: No focal deficit present.     Mental Status: She is alert and oriented to person, place, and time.     Cranial Nerves: Cranial nerves 2-12 are intact. No facial asymmetry.     Sensory: Sensation is intact. No sensory deficit.  Motor: Motor function is intact.     Coordination: Coordination is intact.     Gait: Gait is intact.     Deep Tendon Reflexes: Reflexes are normal and symmetric.  Psychiatric:        Mood and Affect: Mood normal.        Behavior: Behavior normal. Behavior is cooperative.        Thought Content: Thought content normal.        Judgment: Judgment normal.      UC Treatments / Results  Labs (all labs ordered are listed, but only abnormal results are displayed) Labs Reviewed - No data to display  EKG   Radiology No results found.  Procedures Procedures (including critical care time)  Medications Ordered in UC Medications  ketorolac (TORADOL) 30 MG/ML injection 30 mg (30 mg Intramuscular Given 03/10/22 1204)    Initial Impression / Assessment and Plan / UC Course  I have reviewed the triage vital signs and the nursing notes.  Pertinent labs & imaging results that were available during my care of the patient were reviewed by me and considered in my medical decision making (see chart for details).     Discussed with patient and significant other that vision was different between both eyes in exam room but no difference was seen when then asked to do regular vision check at 20 feet. Still discussed that left eye appears slightly weaker and right eye may be trying to compensate which could be contributing to pain and headaches. Also discussed that her symptoms are fairly typical of migraine headache. May need further evaluation. No distinct red flags or need for emergent evaluation.  Will give Toradol 30mg  IM (patient nervous about needles and wanted lower dose)  to help with eye pain and headache. May try Naproxen 500mg  every 12 hours as needed for pain- take with food (patient has sensitive of dye of pills- will request capsules if available).  Reviewed that she should still see an Eye Doctor for further evaluation of vision and for a more thorough eye exam- patient plans to schedule appointment today. If eye pain or headache gets worse with any vomiting or change in vision, go to the ER ASAP. Otherwise, follow-up with the Eye Doctor as planned.  Final Clinical Impressions(s) / UC Diagnoses   Final diagnoses:  Eye pain, right  Acute intractable headache, unspecified headache type  Photophobia of both eyes     Discharge Instructions      You were given a shot of Toradol today to help with pain and inflammation. Recommend start Naproxen 500mg  every 12 hours as needed for pain- take with food. When we first tested your vision, your vision is better with your right eye than left which may be causing your some strain on your right eye and causing pain. Even though your vision appears to be the same for both eyes on the eye chart, I would still recommend that you see an Eye Doctor as soon as possible for further evaluation. If pain gets worse and vision gets worse, go to the ER ASAP. Otherwise, follow-up with the Eye doctor as planned.      ED Prescriptions     Medication Sig Dispense Auth. Provider   naproxen (NAPROSYN) 500 MG tablet Take 1 tablet (500 mg total) by mouth every 12 (twelve) hours as needed for moderate pain or headache. 30 tablet Shivon Hackel, Nicholes Stairs, NP      PDMP not reviewed this encounter.   Katy Apo,  NP 03/11/22 1144

## 2022-03-10 NOTE — Discharge Instructions (Addendum)
You were given a shot of Toradol today to help with pain and inflammation. Recommend start Naproxen 500mg  every 12 hours as needed for pain- take with food. When we first tested your vision, your vision is better with your right eye than left which may be causing your some strain on your right eye and causing pain. Even though your vision appears to be the same for both eyes on the eye chart, I would still recommend that you see an Eye Doctor as soon as possible for further evaluation. If pain gets worse and vision gets worse, go to the ER ASAP. Otherwise, follow-up with the Eye doctor as planned.

## 2022-03-22 ENCOUNTER — Ambulatory Visit (INDEPENDENT_AMBULATORY_CARE_PROVIDER_SITE_OTHER): Payer: Self-pay | Admitting: Family Medicine

## 2022-03-22 ENCOUNTER — Encounter: Payer: Self-pay | Admitting: Family Medicine

## 2022-03-22 ENCOUNTER — Telehealth: Payer: Self-pay

## 2022-03-22 VITALS — BP 134/82 | HR 88 | Temp 99.0°F | Ht 67.0 in | Wt 230.0 lb

## 2022-03-22 DIAGNOSIS — Z124 Encounter for screening for malignant neoplasm of cervix: Secondary | ICD-10-CM

## 2022-03-22 DIAGNOSIS — L918 Other hypertrophic disorders of the skin: Secondary | ICD-10-CM | POA: Insufficient documentation

## 2022-03-22 DIAGNOSIS — R5383 Other fatigue: Secondary | ICD-10-CM

## 2022-03-22 DIAGNOSIS — J3489 Other specified disorders of nose and nasal sinuses: Secondary | ICD-10-CM | POA: Insufficient documentation

## 2022-03-22 DIAGNOSIS — D649 Anemia, unspecified: Secondary | ICD-10-CM | POA: Insufficient documentation

## 2022-03-22 DIAGNOSIS — H543 Unqualified visual loss, both eyes: Secondary | ICD-10-CM

## 2022-03-22 LAB — VITAMIN B12: Vitamin B-12: 562 pg/mL (ref 211–911)

## 2022-03-22 LAB — CBC WITH DIFFERENTIAL/PLATELET
Basophils Absolute: 0.1 10*3/uL (ref 0.0–0.1)
Basophils Relative: 2 % (ref 0.0–3.0)
Eosinophils Absolute: 0.2 10*3/uL (ref 0.0–0.7)
Eosinophils Relative: 3.1 % (ref 0.0–5.0)
HCT: 22.1 % — CL (ref 36.0–46.0)
Hemoglobin: 6.7 g/dL — CL (ref 12.0–15.0)
Lymphocytes Relative: 38.7 % (ref 12.0–46.0)
Lymphs Abs: 2.6 10*3/uL (ref 0.7–4.0)
MCHC: 30.3 g/dL (ref 30.0–36.0)
MCV: 60.1 fl — ABNORMAL LOW (ref 78.0–100.0)
Monocytes Absolute: 0.5 10*3/uL (ref 0.1–1.0)
Monocytes Relative: 7.2 % (ref 3.0–12.0)
Neutro Abs: 3.3 10*3/uL (ref 1.4–7.7)
Neutrophils Relative %: 49 % (ref 43.0–77.0)
Platelets: 292 10*3/uL (ref 150.0–400.0)
RBC: 3.68 Mil/uL — ABNORMAL LOW (ref 3.87–5.11)
RDW: 19.8 % — ABNORMAL HIGH (ref 11.5–15.5)
WBC: 6.7 10*3/uL (ref 4.0–10.5)

## 2022-03-22 LAB — COMPREHENSIVE METABOLIC PANEL
ALT: 15 U/L (ref 0–35)
AST: 18 U/L (ref 0–37)
Albumin: 4.2 g/dL (ref 3.5–5.2)
Alkaline Phosphatase: 92 U/L (ref 39–117)
BUN: 12 mg/dL (ref 6–23)
CO2: 24 mEq/L (ref 19–32)
Calcium: 9 mg/dL (ref 8.4–10.5)
Chloride: 104 mEq/L (ref 96–112)
Creatinine, Ser: 0.7 mg/dL (ref 0.40–1.20)
GFR: 117.05 mL/min (ref >60.00)
Glucose, Bld: 86 mg/dL (ref 70–99)
Potassium: 3.6 mEq/L (ref 3.5–5.1)
Sodium: 135 mEq/L (ref 135–145)
Total Bilirubin: 0.3 mg/dL (ref 0.2–1.2)
Total Protein: 7.6 g/dL (ref 6.0–8.3)

## 2022-03-22 LAB — FOLATE: Folate: 7.2 ng/mL (ref >5.9)

## 2022-03-22 NOTE — Telephone Encounter (Signed)
Called pt and informed her to go to ED per PCP as she will need a blood transfusion. Pt verbalized understanding and states she will go there now.

## 2022-03-22 NOTE — Assessment & Plan Note (Signed)
Referral to OB/GYN for her first screening pap smear.

## 2022-03-22 NOTE — Telephone Encounter (Signed)
CRITICAL VALUE STICKER  CRITICAL VALUE: Hemoglobin 6.7   Hematocrit 22.1  RECEIVER (on-site recipient of call): Jarrett Soho  DATE & TIME NOTIFIED: 03/22/2022 at 4:47 pm  MESSENGER (representative from lab): Earnest Bailey  MD NOTIFIED: Harland Dingwall  TIME OF NOTIFICATION: 4:48 pm

## 2022-03-22 NOTE — Patient Instructions (Signed)
Please go down to the lab on the first floor before you leave.   I referred you to an OB/GYN and they should call you to schedule an appointment.  Let me know if you do not hear in a couple of weeks.  Schedule a visit to see Dr. Ronnald Ramp for the skin tag in your nose.  We will be in touch with your lab results.  Follow-up here as needed.

## 2022-03-22 NOTE — Assessment & Plan Note (Signed)
Check labs. May be due to anemia.

## 2022-03-22 NOTE — Progress Notes (Signed)
New Patient Office Visit  Subjective    Patient ID: Amber Guerra, female    DOB: [birth year]  Age: 31 y.o. MRN: [id number]  CC:  Chief Complaint  Patient presents with   Chest Pain   Eye Pain    When she turns her right eye to the right     HPI Amber Guerra presents to establish care  She was having chest pain with vaping. She was seen in the ED for chest pain on 12/31/2021 and diagnosed with costochondritis. naproxen No longer has chest pain or eye pain which she was evaluated in the ED for over the previous few weeks.  States her vision is getting worse and plans to see an eye doctor. No blurred or double vision.   C/o fatigue. States she eats ice. Hx of anemia. Denies fever, chills, dizziness, palpitations, shortness of breath, abdominal pain, N/V/D.   Requests referral to OB/GYN   LMP: 2 weeks ago Regular cycles. States she does not bleed heavily and it lasts 3 days typically.  Never had a pap smear.  Female partner   States she has a skin tag or mole in her left nare and would like to have it removed. Non tender.   Smokes Black and Milds.  Marijuana everyday.   Occasional alcohol use  Works as a [employer] Medications as of 03/22/2022  Medication Sig   naproxen (NAPROSYN) 500 MG tablet Take 1 tablet (500 mg total) by mouth every 12 (twelve) hours as needed for moderate pain or headache.   No facility-administered encounter medications on file as of 03/22/2022.    Past Medical History:  Diagnosis Date   Anemia     History reviewed. No pertinent surgical history.  Family History  Problem Relation Age of Onset   Diabetes Brother    Aneurysm Paternal Grandmother     Social History   Socioeconomic History   Marital status: Single    Spouse name: Not on file   Number of children: Not on file   Years of education: Not on file   Highest education level: Not on file  Occupational History   Not on file  Tobacco Use   Smoking  status: Some Days    Types: Cigars   Smokeless tobacco: Never  Vaping Use   Vaping Use: Former  Substance and Sexual Activity   Alcohol use: Yes    Comment: social   Drug use: Yes    Types: Marijuana   Sexual activity: Yes    Birth control/protection: None  Other Topics Concern   Not on file  Social History Narrative   Not on file   Social Determinants of Health   Financial Resource Strain: Not on file  Food Insecurity: Not on file  Transportation Needs: Not on file  Physical Activity: Not on file  Stress: Not on file  Social Connections: Not on file  Intimate Partner Violence: Not on file    ROS      Objective    BP 134/82   Pulse 88   Temp 99 F (37.2 C) (Oral)   Ht 5\' 7"  (1.702 m)   Wt 230 lb (104.3 kg)   LMP 03/06/2022 (Approximate)   SpO2 98%   BMI 36.02 kg/m   Physical Exam Constitutional:      General: She is not in acute distress.    Appearance: She is not ill-appearing.  HENT:     Nose:     Comments:  Wart like lesion in the opening of left nare Eyes:     Extraocular Movements: Extraocular movements intact.     Pupils: Pupils are equal, round, and reactive to light.  Cardiovascular:     Rate and Rhythm: Normal rate and regular rhythm.  Pulmonary:     Breath sounds: Normal breath sounds.  Musculoskeletal:     Cervical back: Normal range of motion and neck supple.  Skin:    General: Skin is warm and dry.  Neurological:     General: No focal deficit present.     Mental Status: She is alert and oriented to person, place, and time.  Psychiatric:        Mood and Affect: Mood normal.        Behavior: Behavior normal.         Assessment & Plan:   Problem List Items Addressed This Visit       Other   Anemia - Primary    Fatigue. Check labs including CBC, B12, folate and iron studies and follow up.       Relevant Orders   CBC with Differential/Platelet (Completed)   Comprehensive metabolic panel (Completed)   Vitamin B12  (Completed)   Iron, TIBC and Ferritin Panel   Folate (Completed)   Decreased vision in both eyes    Schedule with eye doctor for exam      Fatigue    Check labs. May be due to anemia.       Lesion of nose    Return for procedure visit with Dr. Yetta Barre. Dr. Yetta Barre is aware and will remove lesion       Pap smear for cervical cancer screening    Referral to OB/GYN for her first screening pap smear.       Relevant Orders   Ambulatory referral to Obstetrics / Gynecology    Return for Needs procedure appointment with Dr. Yetta Barre.   Hetty Blend, NP-C

## 2022-03-22 NOTE — Assessment & Plan Note (Signed)
Return for procedure visit with Dr. Ronnald Ramp. Dr. Ronnald Ramp is aware and will remove lesion

## 2022-03-22 NOTE — Assessment & Plan Note (Signed)
Schedule with eye doctor for exam

## 2022-03-22 NOTE — Assessment & Plan Note (Signed)
Fatigue. Check labs including CBC, B12, folate and iron studies and follow up.

## 2022-03-23 LAB — IRON,TIBC AND FERRITIN PANEL
%SAT: 1 % (calc) — ABNORMAL LOW (ref 16–45)
Ferritin: 3 ng/mL — ABNORMAL LOW (ref 16–154)
Iron: <10 ug/dL — ABNORMAL LOW (ref 40–190)
TIBC: 519 mcg/dL (calc) — ABNORMAL HIGH (ref 250–450)

## 2022-03-23 NOTE — Telephone Encounter (Signed)
Patient's spouse called back and said that she would tell patient that she needed to go, but she did not go yesterday because she didn't feel comfortable about going.

## 2022-03-23 NOTE — Progress Notes (Signed)
I attempted to call patient to let her know that I will order an iron infusion for her and to hopefully keep her from having to go to the emergency department. She did not answer and no voice mail set up. Please try to reach her again and I will order the iron infusion.

## 2022-03-23 NOTE — Progress Notes (Signed)
Her iron is severely low. Please find out if she was seen somewhere and what they found out. Did she get blood? Thanks.

## 2022-04-20 ENCOUNTER — Telehealth: Payer: Self-pay | Admitting: Family Medicine

## 2022-04-20 NOTE — Telephone Encounter (Signed)
Noted in last office visit: Return for procedure visit with Dr. Ronnald Ramp. Dr. Ronnald Ramp is aware and will remove lesion. Called pt and scheduled

## 2022-04-20 NOTE — Telephone Encounter (Signed)
Pt called to cancel appt 04/22/22 with PCP. Pt states she was to see a different provider in-house to have skin tag on nose removed. Informed pt her appt 04/22/22 was with PCP, not a different provider. Pt request return call to advise . Pt phone [phone]

## 2022-04-22 ENCOUNTER — Ambulatory Visit: Payer: Medicaid Other | Admitting: Family Medicine

## 2022-05-31 ENCOUNTER — Ambulatory Visit: Payer: Medicaid Other | Admitting: Internal Medicine

## 2022-07-12 ENCOUNTER — Ambulatory Visit: Payer: Medicaid Other | Admitting: Internal Medicine

## 2022-09-01 ENCOUNTER — Encounter: Payer: Medicaid Other | Admitting: Obstetrics and Gynecology

## 2022-09-01 NOTE — Progress Notes (Deleted)
   ANNUAL EXAM Patient name: Amber Guerra MRN [id number]  Date of birth: 1990/11/13 Chief Complaint:   No chief complaint on file.  History of Present Illness:   Amber Guerra is a 32 y.o. No obstetric history on file. being seen today for a routine annual exam.  Current complaints:   Menstrual concerns? {yes/no:20286}  regular, lasting 3 days Breast or nipple changes? {yes/no:20286}  Contraception use? {yes/no:20286}  Sexually active? {yes/no:20286}   No LMP recorded.   The pregnancy intention screening data noted above was reviewed. Potential methods of contraception were discussed. The patient elected to proceed with No data recorded.   Last pap none prior Last mammogram: n/a Last colonoscopy: n/a     03/22/2022    3:50 PM  Depression screen PHQ 2/9  Decreased Interest 1  Down, Depressed, Hopeless 1  PHQ - 2 Score 2  Altered sleeping 0  Tired, decreased energy 0  Change in appetite 0  Feeling bad or failure about yourself  0  Trouble concentrating 0  Moving slowly or fidgety/restless 0  Suicidal thoughts 0  PHQ-9 Score 2  Difficult doing work/chores Not difficult at all         No data to display           Review of Systems:   Pertinent items are noted in HPI Denies any headaches, blurred vision, fatigue, shortness of breath, chest pain, abdominal pain, abnormal vaginal discharge/itching/odor/irritation, problems with periods, bowel movements, urination, or intercourse unless otherwise stated above. Pertinent History Reviewed:  Reviewed past medical,surgical, social and family history.  Reviewed problem list, medications and allergies. Physical Assessment:  There were no vitals filed for this visit.There is no height or weight on file to calculate BMI.        Physical Examination:   General appearance - well appearing, and in no distress  Mental status - alert, oriented to person, place, and time  Psych:  She has a normal mood and affect  Skin -  warm and dry, normal color, no suspicious lesions noted  Chest - effort normal, all lung fields clear to auscultation bilaterally  Heart - normal rate and regular rhythm  Breasts - breasts appear normal, no suspicious masses, no skin or nipple changes or  axillary nodes  Abdomen - soft, nontender, nondistended, no masses or organomegaly  Pelvic -  VULVA: normal appearing vulva with no masses, tenderness or lesions   VAGINA: normal appearing vagina with normal color and discharge, no lesions   CERVIX: normal appearing cervix without discharge or lesions, no CMT  Thin prep pap is {Desc; done/not:10129} *** HR HPV cotesting  UTERUS: uterus is felt to be normal size, shape, consistency and nontender   ADNEXA: No adnexal masses or tenderness noted.  Extremities:  No swelling or varicosities noted  Chaperone present for exam  No results found for this or any previous visit (from the past 24 hour(s)).    Assessment & Plan:  There are no diagnoses linked to this encounter.  Labs/procedures today: ***  Mammogram: {Mammo f/u:25212::"@ 30yo"}, or sooner if problems Colonoscopy: {TCS f/u:25213::"@ 30yo"}, or sooner if problems  No orders of the defined types were placed in this encounter.   Meds: No orders of the defined types were placed in this encounter.   Follow-up: No follow-ups on file.  Darliss Cheney, MD 09/01/2022 10:01 AM

## 2022-09-09 ENCOUNTER — Ambulatory Visit
Admission: EM | Admit: 2022-09-09 | Discharge: 2022-09-09 | Disposition: A | Discharge status: Home / Self Care | Payer: Medicaid Other | Attending: Internal Medicine | Admitting: Internal Medicine

## 2022-09-09 DIAGNOSIS — K0889 Other specified disorders of teeth and supporting structures: Secondary | ICD-10-CM | POA: Diagnosis not present

## 2022-09-09 DIAGNOSIS — K047 Periapical abscess without sinus: Secondary | ICD-10-CM | POA: Diagnosis not present

## 2022-09-09 MED ORDER — AMOXICILLIN-POT CLAVULANATE 875-125 MG PO TABS: 1.0000 | ORAL_TABLET | Freq: Two times a day (BID) | ORAL | 0 refills | Status: DC

## 2022-09-09 MED ORDER — NAPROXEN 500 MG PO TABS: 500.0000 mg | ORAL_TABLET | Freq: Two times a day (BID) | ORAL | 0 refills | Status: AC | PRN

## 2022-09-09 NOTE — ED Triage Notes (Signed)
Triage completed by provider.

## 2022-09-09 NOTE — ED Provider Notes (Signed)
[name]    CSN: [id number] Arrival date & time: 09/09/22  0802      History   Chief Complaint No chief complaint on file.   HPI Amber Guerra is a 32 y.o. female.   Patient presents to urgent care for evaluation of left upper tooth pain that started approximately 1 week ago.  She has a broken tooth to the area of tenderness and has been placing dental packing in the area to reduce pain associated with cold sensitivity and pain with eating.  She states this has helped slightly but she remains in significant pain.  States she feels as though the left side of her mouth is beginning to swell.  She has not had any fever, chills, nausea, vomiting, abdominal pain, sore throat, recent dental work, chest pain, heart palpitations, ear pain, or headache.  No tongue swelling or sensation of throat closure.  No recent antibiotic or steroid use.  Has an appointment with her dentist on September 29, 2022 (3 weeks from now).  Has been using 200 mg of ibuprofen to help with pain without much relief.  Last dose was this morning.     Past Medical History:  Diagnosis Date   Anemia     Patient Active Problem List   Diagnosis Date Noted   Anemia 03/22/2022   Pap smear for cervical cancer screening 03/22/2022   Decreased vision in both eyes 03/22/2022   Lesion of nose 03/22/2022   Fatigue 03/22/2022    No past surgical history on file.  OB History   No obstetric history on file.      Home Medications    Prior to Admission medications   Medication Sig Start Date End Date Taking? Authorizing Provider  amoxicillin-clavulanate (AUGMENTIN) 875-125 MG tablet Take 1 tablet by mouth every 12 (twelve) hours. 09/09/22  Yes Talbot Grumbling, FNP  naproxen (NAPROSYN) 500 MG tablet Take 1 tablet (500 mg total) by mouth every 12 (twelve) hours as needed for moderate pain or headache. 09/09/22   Talbot Grumbling, FNP    Family History Family History  Problem Relation Age of  Onset   Diabetes Brother    Aneurysm Paternal Grandmother     Social History Social History   Tobacco Use   Smoking status: Some Days    Types: Cigars   Smokeless tobacco: Never  Vaping Use   Vaping Use: Former  Substance Use Topics   Alcohol use: Yes    Comment: social   Drug use: Yes    Types: Marijuana     Allergies   Patient has no known allergies.   Review of Systems Review of Systems Per HPI  Physical Exam Triage Vital Signs ED Triage Vitals  Enc Vitals Group     BP 09/09/22 0834 135/85     Pulse Rate 09/09/22 0834 70     Resp 09/09/22 0834 15     Temp 09/09/22 0834 98.1 F (36.7 C)     Temp Source 09/09/22 0834 Oral     SpO2 09/09/22 0834 97 %     Weight --      Height --      Head Circumference --      Peak Flow --      Pain Score 09/09/22 0917 2     Pain Loc --      Pain Edu? --      Excl. in [place]? --    No data found.  Updated Vital Signs BP  135/85 (BP Location: Right Arm)   Pulse 70   Temp 98.1 F (36.7 C) (Oral)   Resp 15   SpO2 97%   Visual Acuity Right Eye Distance:   Left Eye Distance:   Bilateral Distance:    Right Eye Near:   Left Eye Near:    Bilateral Near:     Physical Exam Vitals and nursing note reviewed.  Constitutional:      Appearance: She is not ill-appearing or toxic-appearing.  HENT:     Head: Normocephalic and atraumatic.     Right Ear: Hearing, tympanic membrane, ear canal and external ear normal.     Left Ear: Hearing, tympanic membrane, ear canal and external ear normal.     Nose: Nose normal.     Mouth/Throat:     Lips: Pink.     Mouth: Mucous membranes are moist. No injury.     Dentition: Abnormal dentition. Dental tenderness and dental caries present. No dental abscesses.     Tongue: No lesions. Tongue does not deviate from midline.     Palate: No mass and lesions.     Pharynx: Oropharynx is clear. Uvula midline. No pharyngeal swelling, oropharyngeal exudate, posterior oropharyngeal erythema or uvula  swelling.     Tonsils: No tonsillar exudate or tonsillar abscesses.      Comments: Multiple missing teeth to mouth with evidence of previous dental caries. No erythema to the overlying tissue.  Eyes:     General: Lids are normal. Vision grossly intact. Gaze aligned appropriately.     Extraocular Movements: Extraocular movements intact.     Conjunctiva/sclera: Conjunctivae normal.  Cardiovascular:     Rate and Rhythm: Normal rate and regular rhythm.     Heart sounds: Normal heart sounds, S1 normal and S2 normal.  Pulmonary:     Effort: Pulmonary effort is normal. No respiratory distress.     Breath sounds: Normal breath sounds and air entry.  Musculoskeletal:     Cervical back: Neck supple.  Lymphadenopathy:     Cervical: No cervical adenopathy.  Skin:    General: Skin is warm and dry.     Capillary Refill: Capillary refill takes less than 2 seconds.     Findings: No rash.  Neurological:     General: No focal deficit present.     Mental Status: She is alert and oriented to person, place, and time. Mental status is at baseline.     Cranial Nerves: No dysarthria or facial asymmetry.  Psychiatric:        Mood and Affect: Mood normal.        Speech: Speech normal.        Behavior: Behavior normal.        Thought Content: Thought content normal.        Judgment: Judgment normal.      UC Treatments / Results  Labs (all labs ordered are listed, but only abnormal results are displayed) Labs Reviewed - No data to display  EKG   Radiology No results found.  Procedures Procedures (including critical care time)  Medications Ordered in UC Medications - No data to display  Initial Impression / Assessment and Plan / UC Course  I have reviewed the triage vital signs and the nursing notes.  Pertinent labs & imaging results that were available during my care of the patient were reviewed by me and considered in my medical decision making (see chart for details).   1.  Dental  pain, dental infection Patient's dental pain  is likely due to developing dental infection to affected broken tooth.  Augmentin sent to cover for infection.  Advised to take this as prescribed twice daily for the next 7 days with food.  Naproxen 500 mg every 12 hours as needed with food for dental inflammation and pain may be used, no other NSAIDs while taking naproxen.  May use Tylenol 1000 mg every 6 hours as needed for breakthrough pain.  Salt water gargles recommended every 3-4 hours.  Advised to keep appointment with dentist on April 10th 2024.  She is agreeable with plan.   Discussed physical exam and available lab work findings in clinic with patient.  Counseled patient regarding appropriate use of medications and potential side effects for all medications recommended or prescribed today. Discussed red flag signs and symptoms of worsening condition,when to call the PCP office, return to urgent care, and when to seek higher level of care in the emergency department. Patient verbalizes understanding and agreement with plan. All questions answered. Patient discharged in stable condition.    Final Clinical Impressions(s) / UC Diagnoses   Final diagnoses:  Pain, dental  Dental infection     Discharge Instructions      Your dental pain is likely due to dental infection. Take Augmentin antibiotic as prescribed for the next 7 days to treat your dental infection. Naproxen 500mg  every 12 hours as needed with food for dental inflammation and pain.  You may also take 1000 mg of Tylenol every 6 hours as needed with this for breakthrough pain. Perform salt water gargles every 3-4 hours.  Keep your appointment with your dentist.  If you develop any new or worsening symptoms or do not improve in the next 2 to 3 days, please return.  If your symptoms are severe, please go to the emergency room.  Follow-up with your primary care provider for further evaluation and management of your symptoms as well as  ongoing wellness visits.  I hope you feel better!   ED Prescriptions     Medication Sig Dispense Auth. Provider   amoxicillin-clavulanate (AUGMENTIN) 875-125 MG tablet Take 1 tablet by mouth every 12 (twelve) hours. 14 tablet Joella Prince M, FNP   naproxen (NAPROSYN) 500 MG tablet Take 1 tablet (500 mg total) by mouth every 12 (twelve) hours as needed for moderate pain or headache. 30 tablet Talbot Grumbling, FNP      PDMP not reviewed this encounter.   Talbot Grumbling, [place] 09/09/22 [phone]

## 2022-09-09 NOTE — Discharge Instructions (Signed)
Your dental pain is likely due to dental infection. Take Augmentin antibiotic as prescribed for the next 7 days to treat your dental infection. Naproxen 500mg  every 12 hours as needed with food for dental inflammation and pain.  You may also take 1000 mg of Tylenol every 6 hours as needed with this for breakthrough pain. Perform salt water gargles every 3-4 hours.  Keep your appointment with your dentist.  If you develop any new or worsening symptoms or do not improve in the next 2 to 3 days, please return.  If your symptoms are severe, please go to the emergency room.  Follow-up with your primary care provider for further evaluation and management of your symptoms as well as ongoing wellness visits.  I hope you feel better!

## 2022-12-01 ENCOUNTER — Ambulatory Visit
Admission: EM | Admit: 2022-12-01 | Discharge: 2022-12-01 | Disposition: A | Discharge status: Home / Self Care | Payer: Medicaid Other | Attending: Emergency Medicine | Admitting: Emergency Medicine

## 2022-12-01 DIAGNOSIS — H01005 Unspecified blepharitis left lower eyelid: Secondary | ICD-10-CM

## 2022-12-01 DIAGNOSIS — G43109 Migraine with aura, not intractable, without status migrainosus: Secondary | ICD-10-CM | POA: Diagnosis not present

## 2022-12-01 MED ORDER — RIZATRIPTAN BENZOATE 5 MG PO TABS: 5.0000 mg | ORAL_TABLET | ORAL | 0 refills | Status: AC | PRN

## 2022-12-01 MED ORDER — KETOROLAC TROMETHAMINE 30 MG/ML IJ SOLN
30.0000 mg | Freq: Once | INTRAMUSCULAR | Status: AC
  Administered 2022-12-01: 30 mg via INTRAMUSCULAR

## 2022-12-01 MED ORDER — ONDANSETRON 4 MG PO TBDP
4.0000 mg | ORAL_TABLET | Freq: Once | ORAL | Status: AC
  Administered 2022-12-01: 4 mg via ORAL

## 2022-12-01 NOTE — ED Provider Notes (Signed)
[name]    CSN: [id number] Arrival date & time: 12/01/22  0902      History   Chief Complaint Chief Complaint  Patient presents with   Eye Problem    Entered by patient    HPI FAVOR MASSOTH is a 32 y.o. female. She woke up 11/29/22 with a L sided headache and her L eyelids swollen. Her L eye hurts. She reports a tiny amount of discharge the last 2 days from inner L eye. Eye has not been crusted shut in the mornings and she has not noticed discharge during the day. Denies injury or fall. Lights bother her. Sounds and smells do not bother her. Does not feel nauseated. No change in vision. Does not feel like something is in her eye. Has been using cold compresses to her eye which has helped the swelling a little but not the eye pain or headache.   Separating out the eyelid swelling and headache that involves eye, pt reports she gets headaches just like this, on the left side, involves pain in L eye, about 2x/month. Usually takes ibuprofen for them that helps, but it is not helping this episode. Can feel when a headache is coming on. They are always in the same location. REgarding eyelid swelling, she has never had this before.     Eye Problem   Past Medical History:  Diagnosis Date   Anemia     Patient Active Problem List   Diagnosis Date Noted   Anemia 03/22/2022   Pap smear for cervical cancer screening 03/22/2022   Decreased vision in both eyes 03/22/2022   Lesion of nose 03/22/2022   Fatigue 03/22/2022    History reviewed. No pertinent surgical history.  OB History   No obstetric history on file.      Home Medications    Prior to Admission medications   Medication Sig Start Date End Date Taking? Authorizing Provider  rizatriptan (MAXALT) 5 MG tablet Take 1 tablet (5 mg total) by mouth as needed for migraine. May repeat in 2 hours if needed 12/01/22  Yes Malea Swilling, Marzella Schlein, NP  amoxicillin-clavulanate (AUGMENTIN) 875-125 MG tablet Take 1 tablet by  mouth every 12 (twelve) hours. 09/09/22   Carlisle Beers, FNP  naproxen (NAPROSYN) 500 MG tablet Take 1 tablet (500 mg total) by mouth every 12 (twelve) hours as needed for moderate pain or headache. 09/09/22   Carlisle Beers, FNP    Family History Family History  Problem Relation Age of Onset   Diabetes Brother    Aneurysm Paternal Grandmother     Social History Social History   Tobacco Use   Smoking status: Some Days    Types: Cigars   Smokeless tobacco: Never  Vaping Use   Vaping Use: Former  Substance Use Topics   Alcohol use: Yes    Comment: social   Drug use: Yes    Types: Marijuana     Allergies   Patient has no known allergies.   Review of Systems Review of Systems   Physical Exam Triage Vital Signs ED Triage Vitals  Enc Vitals Group     BP 12/01/22 0925 137/83     Pulse Rate 12/01/22 0925 70     Resp 12/01/22 0925 18     Temp 12/01/22 0925 98.1 F (36.7 C)     Temp Source 12/01/22 0925 Oral     SpO2 12/01/22 0925 98 %     Weight 12/01/22 0924 224 lb (101.6 kg)  Height 12/01/22 0924 5\' 7"  (1.702 m)     Head Circumference --      Peak Flow --      Pain Score 12/01/22 0923 8     Pain Loc --      Pain Edu? --      Excl. in [place]? --    No data found.  Updated Vital Signs BP 137/83 (BP Location: Left Arm)   Pulse 70   Temp 98.1 F (36.7 C) (Oral)   Resp 18   Ht 5\' 7"  (1.702 m)   Wt 224 lb (101.6 kg)   SpO2 98%   BMI 35.08 kg/m   Visual Acuity Right Eye Distance:   Left Eye Distance:   Bilateral Distance:    Right Eye Near:   Left Eye Near:    Bilateral Near:     Physical Exam Constitutional:      Appearance: Normal appearance. She is not ill-appearing.     Comments: Wearing sunglasses inside due to light sensitivity  Eyes:     General:        Left eye: No hordeolum.     Conjunctiva/sclera: Conjunctivae normal.     Left eye: Left conjunctiva is not injected. No chemosis or exudate.    Pupils: Pupils are equal,  round, and reactive to light.     Comments: R eye grossly normal  Pulmonary:     Effort: Pulmonary effort is normal.  Neurological:     Mental Status: She is alert and oriented to person, place, and time.     Gait: Gait normal.      UC Treatments / Results  Labs (all labs ordered are listed, but only abnormal results are displayed) Labs Reviewed - No data to display  EKG   Radiology No results found.  Procedures Procedures (including critical care time)  Medications Ordered in UC Medications  ketorolac (TORADOL) 30 MG/ML injection 30 mg (30 mg Intramuscular Given 12/01/22 0954)  ondansetron (ZOFRAN-ODT) disintegrating tablet 4 mg (4 mg Oral Given 12/01/22 0955)    Initial Impression / Assessment and Plan / UC Course  I have reviewed the triage vital signs and the nursing notes.  Pertinent labs & imaging results that were available during my care of the patient were reviewed by me and considered in my medical decision making (see chart for details).     I suspect she is having a migraine typical for her AND also has blepharitis, given her symptoms. Given toradol and zofran. Rx maxalt to use for next headache. Pt will pay attention to triggers and discuss headaches with PCP within the next month. REviewed blepharitis home care.   Final Clinical Impressions(s) / UC Diagnoses   Final diagnoses:  Blepharitis of left lower eyelid, unspecified type  Migraine with aura and without status migrainosus, not intractable     Discharge Instructions      Follow up with Ms. Henson about your headaches. Next headache, use the rizatriptan (prescription migraine medicine) as prescribed instead of ibuprofen. Let Ms. Suezanne Jacquet know if it works for your or not. Also pay attention to things that might be triggering your migraines - see attached handout for common triggers.   Use a warm washcloth at least twice a day to your left eye. Use a clean washcloth each time. Also wash your eyelashes  with tear-free baby shampoo at least once a day.    ED Prescriptions     Medication Sig Dispense Auth. Provider   rizatriptan (MAXALT) 5 MG  tablet Take 1 tablet (5 mg total) by mouth as needed for migraine. May repeat in 2 hours if needed 10 tablet Betsi Crespi, Marzella Schlein, NP      PDMP not reviewed this encounter.   Cathlyn Parsons, NP 12/01/22 1021

## 2022-12-01 NOTE — Discharge Instructions (Signed)
Follow up with Amber Guerra about your headaches. Next headache, use the rizatriptan (prescription migraine medicine) as prescribed instead of ibuprofen. Let Amber Guerra know if it works for your or not. Also pay attention to things that might be triggering your migraines - see attached handout for common triggers.   Use a warm washcloth at least twice a day to your left eye. Use a clean washcloth each time. Also wash your eyelashes with tear-free baby shampoo at least once a day.

## 2022-12-01 NOTE — ED Triage Notes (Signed)
Patient here today with c/o left eye pain and swelling since Monday. Swelling seems to be improving. She has been using frozen peas which seems to help. She took IBU which helped with the pain. She has some burry vision at times and light sensitivity. She has been having a slight headache since Monday as well.

## 2022-12-16 ENCOUNTER — Encounter: Payer: Medicaid Other | Admitting: Family Medicine

## 2023-11-18 ENCOUNTER — Ambulatory Visit
Admission: EM | Admit: 2023-11-18 | Discharge: 2023-11-18 | Disposition: A | Discharge status: Home / Self Care | Attending: Physician Assistant | Admitting: Physician Assistant

## 2023-11-18 ENCOUNTER — Other Ambulatory Visit: Payer: Self-pay

## 2023-11-18 DIAGNOSIS — H02843 Edema of right eye, unspecified eyelid: Secondary | ICD-10-CM | POA: Diagnosis not present

## 2023-11-18 DIAGNOSIS — L03213 Periorbital cellulitis: Secondary | ICD-10-CM

## 2023-11-18 DIAGNOSIS — H00011 Hordeolum externum right upper eyelid: Secondary | ICD-10-CM | POA: Diagnosis not present

## 2023-11-18 MED ORDER — CEPHALEXIN 500 MG PO CAPS: 500.0000 mg | ORAL_CAPSULE | Freq: Four times a day (QID) | ORAL | 0 refills | Status: AC

## 2023-11-18 MED ORDER — TETRACAINE HCL 0.5 % OP SOLN
1.0000 [drp] | Freq: Once | OPHTHALMIC | Status: AC
  Administered 2023-11-18: 2 [drp] via OPHTHALMIC

## 2023-11-18 NOTE — ED Triage Notes (Signed)
 Pt presents with complaints of right eye swelling x 1 day. Although, pt states her right eye has been feeling "achy" for the past three days. Pt currently rates her overall pain a 7/10. OTC eye drops applied. Tylenol and Claritin taken with some relief. Eye wash performed at home. Feels like there is something in eye. Right eye is sensitive to light and vision is blurry.

## 2023-11-18 NOTE — ED Provider Notes (Addendum)
 [facility]    CSN: [id number] Arrival date & time: 11/18/23  1002      History   Chief Complaint Chief Complaint  Patient presents with   Facial Swelling    HPI Amber Guerra is a 33 y.o. female.   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 No pertinent surgical history.  OB History   No obstetric history on file.      Home Medications    Prior to Admission medications   Medication Sig Start Date End Date Taking? Authorizing Provider  cephALEXin  (KEFLEX ) 500 MG capsule Take 1 capsule (500 mg total) by mouth 4 (four) times daily for 7 days. 11/18/23 11/25/23 Yes Rhyker Silversmith E, PA-C  naproxen  (NAPROSYN ) 500 MG tablet Take 1 tablet (500 mg total) by mouth every 12 (twelve) hours as needed for moderate pain or headache. 09/09/22   Starlene Eaton, FNP  rizatriptan  (MAXALT ) 5 MG tablet Take 1 tablet (5 mg total) by mouth as needed for migraine. May repeat in 2 hours if needed 12/01/22   Blinda Burger, NP    Family History Family History  Problem Relation Age of Onset   Diabetes Brother    Aneurysm  Paternal Grandmother     Social History Social History   Tobacco Use   Smoking status: Some Days    Types: Cigars   Smokeless tobacco: Never  Vaping Use   Vaping status: Former  Substance Use Topics   Alcohol use: Yes    Comment: social   Drug use: Yes    Types: Marijuana     Allergies   Patient has no known allergies.   Review of Systems Review of Systems  Constitutional:  Negative for chills and fever.  Eyes:  Positive for photophobia, pain and visual disturbance. Negative for discharge and redness.     Physical Exam Triage Vital Signs ED Triage Vitals  Encounter Vitals Group     BP 11/18/23 1022 134/85     Systolic BP Percentile --      Diastolic BP Percentile --      Pulse Rate 11/18/23 1022 76     Resp 11/18/23 1022 18     Temp 11/18/23 1022 98.6 F (37 C)     Temp Source 11/18/23 1022 Oral     SpO2 11/18/23 1022 98 %     Weight 11/18/23 1024 220 lb (99.8 kg)     Height 11/18/23 1024 5\' 7"  (1.702 m)     Head Circumference --  Peak Flow --      Pain Score 11/18/23 1023 7     Pain Loc --      Pain Education --      Exclude from Growth Chart --    No data found.  Updated Vital Signs BP 134/85 (BP Location: Right Arm)   Pulse 76   Temp 98.6 F (37 C) (Oral)   Resp 18   Ht 5\' 7"  (1.702 m)   Wt 220 lb (99.8 kg)   LMP 11/08/2023 (Exact Date)   SpO2 98%   BMI 34.46 kg/m   Visual Acuity Right Eye Distance:   Left Eye Distance:   Bilateral Distance:    Right Eye Near:   Left Eye Near:    Bilateral Near:     Physical Exam Vitals reviewed.  Constitutional:      General: She is awake. She is not in acute distress.    Appearance: Normal appearance. She is well-developed and well-groomed. She is not ill-appearing or toxic-appearing.  HENT:     Head: Normocephalic and atraumatic.  Eyes:     General:        Right eye: Hordeolum present. No foreign body or discharge.        Left eye: No foreign body, discharge or hordeolum.      Extraocular Movements: Extraocular movements intact.     Right eye: Normal extraocular motion and no nystagmus.     Left eye: Normal extraocular motion and no nystagmus.     Conjunctiva/sclera: Conjunctivae normal.     Right eye: Right conjunctiva is not injected. No chemosis, exudate or hemorrhage.    Left eye: Left conjunctiva is not injected. No chemosis, exudate or hemorrhage.    Pupils: Pupils are equal, round, and reactive to light.     Right eye: No corneal abrasion or fluorescein uptake.     Left eye: No corneal abrasion or fluorescein uptake.     Comments: Right eye findings Pt has pain and twitching with EOM testing. EOM appears to be intact and symmetrical but is painful per patient  She has swelling of the upper eyelid that is rather extensive. No obvious signs of inferior swelling or lower lid swelling. Globe does not appear displaced  No obvious signs of corneal abrasion or fluorescein uptake on exam  Neurological:     General: No focal deficit present.     Mental Status: She is alert and oriented to person, place, and time.  Psychiatric:        Mood and Affect: Mood normal.        Behavior: Behavior normal. Behavior is cooperative.        Thought Content: Thought content normal.        Judgment: Judgment normal.      UC Treatments / Results  Labs (all labs ordered are listed, but only abnormal results are displayed) Labs Reviewed - No data to display  EKG   Radiology No results found.  Procedures Procedures (including critical care time)  Medications Ordered in UC Medications  tetracaine (PONTOCAINE) 0.5 % ophthalmic solution 1-2 drop (has no administration in time range)    Initial Impression / Assessment and Plan / UC Course  I have reviewed the triage vital signs and the nursing notes.  Pertinent labs & imaging results that were available during my care of the patient were reviewed by me and considered in my medical decision making (see chart for  details).      Final Clinical Impressions(s) /  UC Diagnoses   Final diagnoses:  Hordeolum externum of right upper eyelid  Swelling of eyelid, right  Periorbital cellulitis of right eye   Patient presents today with concerns of right upper eyelid swelling that has been ongoing for about a day.  She reports that she has had eye pain for the past 3 days but swelling became more severe today.  She has tried Tylenol as well as eyedrops and an eye rinse but this has not provided adequate relief.  Physical exam is notable for significant right upper eyelid swelling.  She does have intact EOMs but notes that there is pain especially with lateral eye movement.  At this time I am unsure if she has an extensive hordeolum versus preseptal cellulitis so I am sending her home with Keflex 500 mg p.o. 4 times daily x 7 days.  Recommend warm compresses to the eye to assist with drainage if this is a hordeolum.  Reviewed that if symptoms are not improving or worsening over the next 1 to 2 days she should go to the emergency room for further evaluation and ongoing management.  ED and return precautions reviewed and provided after visit summary.  Follow-up as needed.    Discharge Instructions      You were seen today for concerns of swelling of your upper right eyelid.  At this time I am concerned that you may have a stye which could be causing your symptoms but given how painful this is I am concerned that you may have an infection called preseptal cellulitis.  Styes are usually treated with warm compresses at home as well as over-the-counter pain medications as needed.  Cellulitis typically needs an antibiotic to clear it so I have sent you home with a medication called Keflex.  I would like you to take this as directed 4 times per day for the next 7 days.  You can use lubricating eyedrops such as blink tears or refresh eyedrops as needed to help with any gritty sensation that may be occurring.  I do recommend  using a warm compress to the eye to help with drainage.  This is usually accomplished with a warm washcloth that has been wet with warm to hot water and applied to the eye for about 15 minutes. if you do not start to see improvement in your symptoms in the next 2 days or if it starts to get worse I recommend following up at the emergency room so that you can be seen by an ophthalmologist for appropriate care.  If at any point you start to develop difficulty moving your eye, severe vision changes, severe headaches, drainage or pus coming from the eye, vision loss please go to the emergency room immediately.   ED Prescriptions     Medication Sig Dispense Auth. Provider   cephALEXin (KEFLEX) 500 MG capsule Take 1 capsule (500 mg total) by mouth 4 (four) times daily for 7 days. 28 capsule Rick Carruthers E, PA-C      PDMP not reviewed this encounter.   Amalea Ottey, Pearla Bottom, PA-C 11/18/23 1122    Pauletta Pickney E, PA-C 11/18/23 1124

## 2023-11-18 NOTE — Discharge Instructions (Addendum)
 You were seen today for concerns of swelling of your upper right eyelid.  At this time I am concerned that you may have a stye which could be causing your symptoms but given how painful this is I am concerned that you may have an infection called preseptal cellulitis.  Styes are usually treated with warm compresses at home as well as over-the-counter pain medications as needed.  Cellulitis typically needs an antibiotic to clear it so I have sent you home with a medication called Keflex.  I would like you to take this as directed 4 times per day for the next 7 days.  You can use lubricating eyedrops such as blink tears or refresh eyedrops as needed to help with any gritty sensation that may be occurring.  I do recommend using a warm compress to the eye to help with drainage.  This is usually accomplished with a warm washcloth that has been wet with warm to hot water and applied to the eye for about 15 minutes. if you do not start to see improvement in your symptoms in the next 2 days or if it starts to get worse I recommend following up at the emergency room so that you can be seen by an ophthalmologist for appropriate care.  If at any point you start to develop difficulty moving your eye, severe vision changes, severe headaches, drainage or pus coming from the eye, vision loss please go to the emergency room immediately.

## 2024-02-08 ENCOUNTER — Encounter (HOSPITAL_COMMUNITY): Payer: Self-pay

## 2024-02-08 ENCOUNTER — Ambulatory Visit: Payer: Self-pay | Admitting: Family Medicine

## 2024-02-08 ENCOUNTER — Encounter: Payer: Self-pay | Admitting: Family Medicine

## 2024-02-08 ENCOUNTER — Ambulatory Visit (INDEPENDENT_AMBULATORY_CARE_PROVIDER_SITE_OTHER): Admitting: Family Medicine

## 2024-02-08 ENCOUNTER — Telehealth: Payer: Self-pay

## 2024-02-08 ENCOUNTER — Ambulatory Visit (HOSPITAL_BASED_OUTPATIENT_CLINIC_OR_DEPARTMENT_OTHER)
Admission: RE | Admit: 2024-02-08 | Discharge: 2024-02-08 | Disposition: A | Discharge status: Home / Self Care | Source: Ambulatory Visit | Attending: Family Medicine | Admitting: Family Medicine

## 2024-02-08 ENCOUNTER — Emergency Department (HOSPITAL_COMMUNITY)
Admission: EM | Admit: 2024-02-08 | Discharge: 2024-02-09 | Discharge status: Against Medical Advice | Attending: Emergency Medicine | Admitting: Emergency Medicine

## 2024-02-08 ENCOUNTER — Other Ambulatory Visit: Payer: Self-pay

## 2024-02-08 VITALS — BP 122/76 | HR 79 | Temp 97.6°F | Ht 67.0 in | Wt 182.0 lb

## 2024-02-08 DIAGNOSIS — K644 Residual hemorrhoidal skin tags: Secondary | ICD-10-CM | POA: Diagnosis not present

## 2024-02-08 DIAGNOSIS — R1900 Intra-abdominal and pelvic swelling, mass and lump, unspecified site: Secondary | ICD-10-CM | POA: Diagnosis not present

## 2024-02-08 DIAGNOSIS — F329 Major depressive disorder, single episode, unspecified: Secondary | ICD-10-CM

## 2024-02-08 DIAGNOSIS — D649 Anemia, unspecified: Secondary | ICD-10-CM

## 2024-02-08 DIAGNOSIS — J3489 Other specified disorders of nose and nasal sinuses: Secondary | ICD-10-CM | POA: Diagnosis not present

## 2024-02-08 DIAGNOSIS — Z5329 Procedure and treatment not carried out because of patient's decision for other reasons: Secondary | ICD-10-CM | POA: Insufficient documentation

## 2024-02-08 DIAGNOSIS — R1909 Other intra-abdominal and pelvic swelling, mass and lump: Secondary | ICD-10-CM

## 2024-02-08 DIAGNOSIS — N920 Excessive and frequent menstruation with regular cycle: Secondary | ICD-10-CM | POA: Insufficient documentation

## 2024-02-08 DIAGNOSIS — K649 Unspecified hemorrhoids: Secondary | ICD-10-CM

## 2024-02-08 DIAGNOSIS — Z124 Encounter for screening for malignant neoplasm of cervix: Secondary | ICD-10-CM

## 2024-02-08 DIAGNOSIS — R42 Dizziness and giddiness: Secondary | ICD-10-CM | POA: Diagnosis present

## 2024-02-08 DIAGNOSIS — D500 Iron deficiency anemia secondary to blood loss (chronic): Secondary | ICD-10-CM | POA: Diagnosis not present

## 2024-02-08 DIAGNOSIS — D259 Leiomyoma of uterus, unspecified: Secondary | ICD-10-CM | POA: Diagnosis not present

## 2024-02-08 LAB — CBC WITH DIFFERENTIAL/PLATELET
Abs Immature Granulocytes: 0.01 K/uL (ref 0.00–0.07)
Basophils Absolute: 0.1 K/uL (ref 0.0–0.1)
Basophils Absolute: 0 K/uL (ref 0.0–0.1)
Basophils Relative: 1.1 % (ref 0.0–3.0)
Basophils Relative: 1 %
Eosinophils Absolute: 0.2 K/uL (ref 0.0–0.7)
Eosinophils Absolute: 0.2 K/uL (ref 0.0–0.5)
Eosinophils Relative: 3.2 % (ref 0.0–5.0)
Eosinophils Relative: 4 %
HCT: 20 % — CL (ref 36.0–46.0)
HCT: 20.7 % — ABNORMAL LOW (ref 36.0–46.0)
Hemoglobin: 5.5 g/dL — CL (ref 12.0–15.0)
Hemoglobin: 5.1 g/dL — CL (ref 12.0–15.0)
Immature Granulocytes: 0 %
Lymphocytes Relative: 28.6 % (ref 12.0–46.0)
Lymphocytes Relative: 35 %
Lymphs Abs: 1.4 K/uL (ref 0.7–4.0)
Lymphs Abs: 1.5 K/uL (ref 0.7–4.0)
MCH: 14.6 pg — ABNORMAL LOW (ref 26.0–34.0)
MCHC: 27.5 g/dL — ABNORMAL LOW (ref 30.0–36.0)
MCHC: 24.6 g/dL — ABNORMAL LOW (ref 30.0–36.0)
MCV: 53.8 fl — ABNORMAL LOW (ref 78.0–100.0)
MCV: 59.1 fL — ABNORMAL LOW (ref 80.0–100.0)
Monocytes Absolute: 0.5 K/uL (ref 0.1–1.0)
Monocytes Absolute: 0.4 K/uL (ref 0.1–1.0)
Monocytes Relative: 8.9 % (ref 3.0–12.0)
Monocytes Relative: 8 %
Neutro Abs: 2.9 K/uL (ref 1.4–7.7)
Neutro Abs: 2.2 K/uL (ref 1.7–7.7)
Neutrophils Relative %: 58.2 % (ref 43.0–77.0)
Neutrophils Relative %: 52 %
Platelets: 347 K/uL (ref 150.0–400.0)
Platelets: 325 K/uL (ref 150–400)
RBC: 3.72 Mil/uL — ABNORMAL LOW (ref 3.87–5.11)
RBC: 3.5 MIL/uL — ABNORMAL LOW (ref 3.87–5.11)
RDW: 21 % — ABNORMAL HIGH (ref 11.5–15.5)
RDW: 21.6 % — ABNORMAL HIGH (ref 11.5–15.5)
WBC: 5.1 K/uL (ref 4.0–10.5)
WBC: 4.3 K/uL (ref 4.0–10.5)
nRBC: 0 % (ref 0.0–0.2)

## 2024-02-08 LAB — TSH: TSH: 1.15 u[IU]/mL (ref 0.35–5.50)

## 2024-02-08 LAB — COMPREHENSIVE METABOLIC PANEL WITH GFR
ALT: 13 U/L (ref 0–35)
ALT: 14 U/L (ref 0–44)
AST: 19 U/L (ref 0–37)
AST: 21 U/L (ref 15–41)
Albumin: 4.3 g/dL (ref 3.5–5.2)
Albumin: 3.5 g/dL (ref 3.5–5.0)
Alkaline Phosphatase: 78 U/L (ref 39–117)
Alkaline Phosphatase: 80 U/L (ref 38–126)
Anion gap: 8 (ref 5–15)
BUN: 9 mg/dL (ref 6–23)
BUN: 10 mg/dL (ref 6–20)
CO2: 24 meq/L (ref 19–32)
CO2: 22 mmol/L (ref 22–32)
Calcium: 8.9 mg/dL (ref 8.4–10.5)
Calcium: 9.2 mg/dL (ref 8.9–10.3)
Chloride: 105 meq/L (ref 96–112)
Chloride: 107 mmol/L (ref 98–111)
Creatinine, Ser: 0.58 mg/dL (ref 0.40–1.20)
Creatinine, Ser: 0.67 mg/dL (ref 0.44–1.00)
GFR, Estimated: >60 mL/min (ref >60)
GFR: 120.87 mL/min (ref >60.00)
Glucose, Bld: 96 mg/dL (ref 70–99)
Glucose, Bld: 99 mg/dL (ref 70–99)
Potassium: 3.8 meq/L (ref 3.5–5.1)
Potassium: 3.4 mmol/L — ABNORMAL LOW (ref 3.5–5.1)
Sodium: 137 meq/L (ref 135–145)
Sodium: 137 mmol/L (ref 135–145)
Total Bilirubin: 0.5 mg/dL (ref 0.2–1.2)
Total Bilirubin: 0.7 mg/dL (ref 0.0–1.2)
Total Protein: 8.2 g/dL (ref 6.0–8.3)
Total Protein: 7.7 g/dL (ref 6.5–8.1)

## 2024-02-08 LAB — FOLATE: Folate: 7.9 ng/mL (ref >5.9)

## 2024-02-08 LAB — HCG, SERUM, QUALITATIVE: Preg, Serum: NEGATIVE

## 2024-02-08 LAB — FERRITIN
Ferritin: 3.3 ng/mL — ABNORMAL LOW (ref 10.0–291.0)
Ferritin: 3 ng/mL — ABNORMAL LOW (ref 11–307)

## 2024-02-08 LAB — IRON AND TIBC
Iron: 9 ug/dL — ABNORMAL LOW (ref 28–170)
Saturation Ratios: 2 % — ABNORMAL LOW (ref 10.4–31.8)
TIBC: 592 ug/dL — ABNORMAL HIGH (ref 250–450)
UIBC: 583 ug/dL

## 2024-02-08 LAB — POC OCCULT BLOOD, ED: Fecal Occult Bld: NEGATIVE

## 2024-02-08 LAB — T4, FREE: Free T4: 0.79 ng/dL (ref 0.60–1.60)

## 2024-02-08 LAB — ABO/RH: ABO/RH(D): O POS

## 2024-02-08 LAB — VITAMIN B12: Vitamin B-12: 646 pg/mL (ref 211–911)

## 2024-02-08 LAB — PREPARE RBC (CROSSMATCH)

## 2024-02-08 MED ORDER — SODIUM CHLORIDE 0.9% IV SOLUTION: Freq: Once | INTRAVENOUS | Status: AC

## 2024-02-08 MED ORDER — IOHEXOL 300 MG/ML  SOLN
100.0000 mL | Freq: Once | INTRAMUSCULAR | Status: AC | PRN
  Administered 2024-02-08: 100 mL via INTRAVENOUS

## 2024-02-08 MED ORDER — FERROUS SULFATE 325 (65 FE) MG PO TABS: 325.0000 mg | ORAL_TABLET | Freq: Every day | ORAL | 0 refills | Status: AC

## 2024-02-08 NOTE — ED Provider Notes (Signed)
 [place] EMERGENCY DEPARTMENT AT [facility] Provider Note   CSN: [id number] Arrival date & time: 02/08/24  1645     Patient presents with: Low Hemoglobin   Amber Guerra is a 33 y.o. female.   Patient is a 33 year old female presenting after the recommendations from her PCP office for anemia.  Patient was seen for worsening fatigue, lightheadedness, dizziness, and exertional dyspnea.  Patient states the symptoms have been slowly progressive since the death of her brother in Oct 02, 2023.  She also states she chews ice frequently since this time.  She admits to a 55 pound weight loss due to satiety.  She denies any nausea or vomiting.  She denies any black or bloody stools.  She denies a family history of colon cancer.  She denies any hematemesis.  She admits to regular menstrual cycles lasting approximately 4 to 5 days, saturating medium sized pads every 4-5 hours.  First day of last menstrual cycle approximately January 16, 2024.  Denies any current vaginal bleeding.    Chart review demonstrates that patient was seen in 2023 and found to have a hemoglobin of 6.7.  She was recommended for transfusion at this time but declined and left prior to completion of workup.  Today's laboratory testing by patient's PCP demonstrates a hemoglobin of 5.5.  The history is provided by the patient. No language interpreter was used.       Prior to Admission medications   Medication Sig Start Date End Date Taking? Authorizing Provider  ferrous sulfate  325 (65 FE) MG tablet Take 1 tablet (325 mg total) by mouth daily. 02/08/24 03/09/24 Yes Elnor Hila P, DO  naproxen  (NAPROSYN ) 500 MG tablet Take 1 tablet (500 mg total) by mouth every 12 (twelve) hours as needed for moderate pain or headache. 09/09/22   Stanhope, Catharine M, FNP  rizatriptan  (MAXALT ) 5 MG tablet Take 1 tablet (5 mg total) by mouth as needed for migraine. May repeat in 2 hours if needed 12/01/22   Richad Jon HERO, NP     Allergies: Patient has no known allergies.    Review of Systems  Constitutional:  Positive for chills and fatigue. Negative for fever.  HENT:  Negative for ear pain and sore throat.   Eyes:  Negative for pain and visual disturbance.  Respiratory:  Negative for cough and shortness of breath.   Cardiovascular:  Negative for chest pain and palpitations.  Gastrointestinal:  Negative for abdominal pain and vomiting.  Genitourinary:  Negative for dysuria and hematuria.  Musculoskeletal:  Negative for arthralgias and back pain.  Skin:  Negative for color change and rash.  Neurological:  Positive for dizziness, weakness and light-headedness. Negative for seizures and syncope.  All other systems reviewed and are negative.   Updated Vital Signs BP (!) 124/91   Pulse 73   Temp 98.2 F (36.8 C) (Oral)   Resp 17   Ht 5' 7 (1.702 m)   Wt 82.5 kg   SpO2 100%   BMI 28.49 kg/m   Physical Exam Vitals and nursing note reviewed.  Constitutional:      General: She is not in acute distress.    Appearance: She is well-developed.  HENT:     Head: Normocephalic and atraumatic.  Eyes:     Conjunctiva/sclera: Conjunctivae normal.  Cardiovascular:     Rate and Rhythm: Normal rate and regular rhythm.     Heart sounds: No murmur heard. Pulmonary:     Effort: Pulmonary effort is normal. No  respiratory distress.     Breath sounds: Normal breath sounds.  Abdominal:     Palpations: Abdomen is soft.     Tenderness: There is no abdominal tenderness.  Genitourinary:    Rectum: Guaiac result negative. External hemorrhoid present. No mass, tenderness or anal fissure. Normal anal tone.  Musculoskeletal:        General: No swelling.     Cervical back: Neck supple.  Skin:    General: Skin is warm and dry.     Capillary Refill: Capillary refill takes less than 2 seconds.  Neurological:     Mental Status: She is alert.  Psychiatric:        Mood and Affect: Mood normal.     (all labs ordered  are listed, but only abnormal results are displayed) Labs Reviewed  CBC WITH DIFFERENTIAL/PLATELET - Abnormal; Notable for the following components:      Result Value   RBC 3.50 (*)    Hemoglobin 5.1 (*)    HCT 20.7 (*)    MCV 59.1 (*)    MCH 14.6 (*)    MCHC 24.6 (*)    RDW 21.6 (*)    All other components within normal limits  COMPREHENSIVE METABOLIC PANEL WITH GFR - Abnormal; Notable for the following components:   Potassium 3.4 (*)    All other components within normal limits  FERRITIN - Abnormal; Notable for the following components:   Ferritin 3 (*)    All other components within normal limits  IRON AND TIBC - Abnormal; Notable for the following components:   Iron 9 (*)    TIBC 592 (*)    Saturation Ratios 2 (*)    All other components within normal limits  POC OCCULT BLOOD, ED  TYPE AND SCREEN  ABO/RH  PREPARE RBC (CROSSMATCH)    EKG: None  Radiology: CT ABDOMEN PELVIS W CONTRAST Result Date: 02/08/2024 CLINICAL DATA:  Palpable abdominal mass. EXAM: CT ABDOMEN AND PELVIS WITH CONTRAST TECHNIQUE: Multidetector CT imaging of the abdomen and pelvis was performed using the standard protocol following bolus administration of intravenous contrast. RADIATION DOSE REDUCTION: This exam was performed according to the departmental dose-optimization program which includes automated exposure control, adjustment of the mA and/or kV according to patient size and/or use of iterative reconstruction technique. CONTRAST:  OMNIPAQUE  IOHEXOL  300 MG/ML  SOLN COMPARISON:  None Available. FINDINGS: Lower chest: The visualized lung bases are clear. No intra-abdominal free air or free fluid. Hepatobiliary: No focal liver abnormality is seen. No gallstones, gallbladder wall thickening, or biliary dilatation. Pancreas: Unremarkable. No pancreatic ductal dilatation or surrounding inflammatory changes. Spleen: Normal in size without focal abnormality. Adrenals/Urinary Tract: The adrenal glands  unremarkable. The kidneys, visualized ureters, and urinary bladder appear unremarkable. Stomach/Bowel: There is no bowel obstruction or active inflammation. The appendix is normal. Vascular/Lymphatic: The abdominal aorta and IVC are unremarkable. No portal venous gas. There is no adenopathy. Reproductive: The uterus is enlarged and myomatous. The largest fibroid measures approximately 11 x 12 x 12 cm. Dilated cystic structures in the region of the neck is a appears somewhat tubular and may represent hydrosalpinx. Ultrasound may provide better evaluation. Other: None Musculoskeletal: No acute osseous pathology. IMPRESSION: 1. No acute intra-abdominal or pelvic pathology. 2. Enlarged myomatous uterus. 3. Possible hydrosalpinx. Ultrasound may provide better evaluation. Electronically Signed   By: Vanetta Chou M.D.   On: 02/08/2024 15:39     .Critical Care  Performed by: Elnor Bernarda SQUIBB, DO Authorized by: Elnor Bernarda SQUIBB, DO  Critical care provider statement:    Critical care time (minutes):  76   Critical care was necessary to treat or prevent imminent or life-threatening deterioration of the following conditions: Anemia requiring blood transfusion.   Critical care was time spent personally by me on the following activities:  Development of treatment plan with patient or surrogate, discussions with consultants, evaluation of patient's response to treatment, examination of patient, ordering and review of laboratory studies, ordering and review of radiographic studies, ordering and performing treatments and interventions, pulse oximetry, re-evaluation of patient's condition and review of old charts    Medications Ordered in the ED  0.9 %  sodium chloride  infusion (Manually program via Guardrails IV Fluids) (0 mLs Intravenous Paused 02/08/24 2139)                                    Medical Decision Making Amount and/or Complexity of Data Reviewed Labs: ordered.  Risk Prescription drug  management.   33 year old female presenting after the recommendations from her PCP office for anemia.  On exam patient is alert oriented x 3, no acute distress, afebrile, stable vital signs.  Her abdomen is soft and nontender.  Her rectal exam demonstrates 1 nonbleeding external hemorrhoid.  No gross blood.  Hemoccult negative.  She also was ordered a CT abdomen and pelvis today by her primary care office that demonstrated concern for uterine fibroids only.  Patient does not admit to history of heavy menstrual cycles.  She is not actively bleeding or on her menstrual cycle.  Repeat lab testing today confirms a hemoglobin of 5.1.  Risk first benefits of blood transfusion described in detail to patient.  2 units packed red blood cell ordered.  Iron, TIBC, and ferritin was ordered today.  Iron 9, TIBC 592, ferritin 3.    Patient was recommended for admission for somatic anemia likely secondary to menorrhagia with a hemoglobin of 5.1 and an iron of 9.  She is recommended for 2 units of packed red blood cell transfusion and an iron transfusion.  She is recommended for follow-up with OB/GYN to initiate oral contraceptive use to help decrease vaginal bleeding.  Patient is adamant that she does not want admission at this time.  She understands that it will likely be for less than 24 to 48 hours and she is still declining.  The patient has requested to leave the ED against medical advice. I believe this patient is of sound mind and medical decision making capacity to refuse medical care. The patient is responding and asking questions appropriately. The patient is oriented to person, place and time. The patient is not psychotic, delusional, suicidal, homicidal or hallucinating. The patient demonstrates a normal mental capacity to make decisions regarding their healthcare. The patient is clinically sober and does not appear to be under the influence of any illicit drugs at this time. The patient has been advised of the  risks, in layman terms, of leaving AMA which include, but are not limited to death, coma, permanent disability, loss of current lifestyle, delay in diagnosis. Alternatives have been offered - the patient remains steadfast in their wish to leave. The patient has been advised that should they change their mind they are welcome to return to this hospital, or any other, at any time. The patient understands that in no way does an AMA discharge mean that I do not want them to have the best medical care  available. To this end, I have offered appropriate prescriptions, referrals, and discharge instructions. The patient did sign AMA paperwork. The above discussion was witnessed by another member of staff.  Patient agreed to blood transfusion prior to discharge.  Agrees to follow-up with OB/GYN.  She agrees to follow-up with her PCP.  Iron supplements ordered.  She already has an established OB/GYN of her PCP referred her to.     Final diagnoses:  Symptomatic anemia  Menorrhagia with regular cycle  Iron deficiency anemia due to chronic blood loss    ED Discharge Orders          Ordered    ferrous sulfate  325 (65 FE) MG tablet  Daily        02/08/24 2239               Elnor Bernarda SQUIBB, DO 02/08/24 2242

## 2024-02-08 NOTE — Progress Notes (Signed)
 Will you attempt to call patient again to see if she is going to the ED?

## 2024-02-08 NOTE — Telephone Encounter (Unsigned)
 Copied from CRM [id number]. Topic: General - Other >> Feb 08, 2024 12:07 PM Rosina BIRCH wrote: Reason for CRM: patient spouse returning a call to the office. I contacted [facility] and because of the long hold, the patient disconnected the call

## 2024-02-08 NOTE — Telephone Encounter (Signed)
 CRITICAL VALUE STICKER  CRITICAL VALUE: Hemoglobin 5.5 Hematocrit 20  RECEIVER (on-site recipient of call): Savera Donson  DATE & TIME NOTIFIED: 08/20 1151am  MESSENGER (representative from lab): Carmena  MD NOTIFIED: Boby Mackintosh NP  TIME OF NOTIFICATION: 1152  RESPONSE:  Patients significant other notified to take patient to the ED. Attempted to reach patient 3x, before calling partner. Partner gave verbal understanding

## 2024-02-08 NOTE — ED Triage Notes (Signed)
 Pt went to PCP today and hemoglobin was 5.5, no hx of low hemoglobin. Pt states she has been getting more fatigued than usual when exerting herself.

## 2024-02-08 NOTE — Patient Instructions (Signed)
 Please go downstairs for labs before you leave.  I ordered a CT of your abdomen and then you will receive a call to get this done in the next couple of days.  I also referred you to OB/GYN and they we will call you to schedule a visit  I referred you to dermatology for the skin tag in your nose and they will call you  I will be in touch with your results  I recommend calling and scheduling with Rolin Pouch, therapist for [place] on the first floor of our building  Use MiraLAX to keep her stools soft and continue with over-the-counter hemorrhoid cream.  Follow-up if this is worsening or not getting better

## 2024-02-08 NOTE — ED Provider Notes (Signed)
  Provider Note MRN:  [id number]  Arrival date & time: 02/08/24    ED Course and Medical Decision Making  Assumed care of patient at sign-out or upon transfer.  Heavy menstrual bleeding with significant anemia receiving blood transfusion, refusing admission, will leave AMA after blood.  Procedures  Final Clinical Impressions(s) / ED Diagnoses     ICD-10-CM   1. Symptomatic anemia  D64.9     2. Menorrhagia with regular cycle  N92.0     3. Iron deficiency anemia due to chronic blood loss  D50.0       ED Discharge Orders          Ordered    ferrous sulfate  325 (65 FE) MG tablet  Daily        02/08/24 2239            Discharge Instructions   None     Ozell HERO. Theadore, MD [facility] Emergency Medicine [facility] [email or web address]    Theadore Ozell HERO, MD 02/08/24 2352

## 2024-02-08 NOTE — Progress Notes (Signed)
 Subjective:     Patient ID: Amber Guerra, female    DOB: [birth year], 33 y.o.   MRN: [id number]  Chief Complaint  Patient presents with   Annual Exam    fasting    HPI  Discussed the use of AI scribe software for clinical note transcription with the patient, who gave verbal consent to proceed.  History of Present Illness Amber Guerra is a 33 year old female who presents with several concerns including depression, grief, skin lesion, rectal pain, abdominal mass. Her girlfriend is with her.   Menstrual abnormalities and anemia - Regular menstrual cycles with heavy flow during the first couple of days - Switched from tampons to pads, resulting in improved menstrual cramps - History of anemia  Nasal skin tag - Persistent skin tag inside the nose, previously discussed in 2023 - Remains bothersome and has not been removed  Perianal lesion - Abnormality in the perianal region, similar to a hemorrhoid, appeared three weeks ago - Initially painful and pruritic, especially during bowel movements - Difficulty sitting due to discomfort - Preparation H provided some relief; lidocaine caused discomfort - Lesion is now smaller and less painful - No rectal bleeding  Abdominal mass and discomfort - Palpable knot in the abdomen causing discomfort when lying on her side - Occasionally necessitates using the bathroom  Lower extremity discoloration - Discoloration present on one leg compared to the other for the past few days  Appetite and weight loss - Significant weight loss following her brother's death in 2023/09/23 - Poor appetite, often eating only once daily   Tobacco use - Smokes cigarettes - Considering reducing tobacco use  Mental health and coping - Considering therapy and has researched therapists - Works as a Interior and spatial designer, 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 No pertinent surgical history.  Family History  Problem Relation Age of Onset   Diabetes Brother    Aneurysm Paternal Grandmother     Social History   Socioeconomic History   Marital status: Single    Spouse name: Not on file   Number of children: Not on file   Years of education: Not on file   Highest education level: Not on file  Occupational History   Not on file  Tobacco Use   Smoking status: Some Days    Types: Cigars   Smokeless tobacco: Never  Vaping Use   Vaping status: Former  Substance and Sexual Activity   Alcohol use: Yes    Comment: social   Drug use: Yes    Types: Marijuana   Sexual activity: Yes    Birth control/protection: None  Other Topics Concern   Not on file  Social History Narrative   Not on file   Social Drivers of Health   Financial Resource Strain: Not on file  Food Insecurity: Not on file  Transportation Needs: Not on file  Physical Activity: Not on file  Stress: Not on file  Social Connections: Not on  file  Intimate Partner Violence: Not on file    Outpatient Medications Prior to Visit  Medication Sig Dispense Refill   naproxen  (NAPROSYN ) 500 MG tablet Take 1 tablet (500 mg total) by mouth every 12 (twelve) hours as needed for moderate pain or headache. 30 tablet 0   rizatriptan  (MAXALT ) 5 MG tablet Take 1 tablet (5 mg total) by mouth as needed for migraine. May repeat in 2 hours if needed 10 tablet 0   No facility-administered medications prior to visit.    No Known Allergies  Review of Systems  Constitutional:  Positive for malaise/fatigue and weight loss. Negative for chills and fever.  Respiratory:  Negative  for shortness of breath.   Cardiovascular:  Negative for chest pain, palpitations and leg swelling.  Gastrointestinal:  Positive for abdominal pain and constipation. Negative for diarrhea, nausea and vomiting.       Hemorrhoid, rectal itching  Hard, tender mass of lower abdomen   Genitourinary:  Negative for dysuria, frequency and urgency.  Neurological:  Negative for dizziness, focal weakness and headaches.  Psychiatric/Behavioral:  Positive for depression. Negative for suicidal ideas. The patient has insomnia.        Objective:    Physical Exam Constitutional:      General: She is not in acute distress.    Appearance: She is not ill-appearing.  HENT:     Mouth/Throat:     Mouth: Mucous membranes are moist.     Pharynx: Oropharynx is clear.  Eyes:     Extraocular Movements: Extraocular movements intact.     Conjunctiva/sclera: Conjunctivae normal.     Pupils: Pupils are equal, round, and reactive to light.  Cardiovascular:     Rate and Rhythm: Normal rate and regular rhythm.  Pulmonary:     Effort: Pulmonary effort is normal.     Breath sounds: Normal breath sounds.  Abdominal:     General: Bowel sounds are normal.     Palpations: There is mass.     Tenderness: There is abdominal tenderness. There is no guarding or rebound.      Comments: Large, tender mass of lower abdomen/pelvis  Musculoskeletal:     Cervical back: Normal range of motion and neck supple. No tenderness.     Right lower leg: No edema.     Left lower leg: No edema.  Lymphadenopathy:     Cervical: No cervical adenopathy.  Skin:    General: Skin is warm and dry.  Neurological:     General: No focal deficit present.     Mental Status: She is alert and oriented to person, place, and time.  Psychiatric:        Mood and Affect: Mood normal.        Behavior: Behavior normal.        Thought Content: Thought content normal.      BP 122/76   Pulse 79   Temp 97.6 F (36.4 C) (Temporal)   Ht 5' 7  (1.702 m)   Wt 182 lb (82.6 kg)   SpO2 96%   BMI 28.51 kg/m  Wt Readings from Last 3 Encounters:  02/08/24 182 lb (82.6 kg)  11/18/23 220 lb (99.8 kg)  12/01/22 224 lb (101.6 kg)       Assessment & Plan:   Problem List Items Addressed This Visit     Anemia   Relevant Orders   Ferritin   Folate   Vitamin B12   TSH   T4, free   Comprehensive metabolic  panel with GFR   CBC with Differential/Platelet   Lesion of nose   Relevant Orders   Ambulatory referral to Dermatology   Other Visit Diagnoses       Abdominal mass of other site    -  Primary   Relevant Orders   Ambulatory referral to Obstetrics / Gynecology   Ferritin   Folate   CT ABDOMEN PELVIS W CONTRAST   Comprehensive metabolic panel with GFR   CBC with Differential/Platelet   hCG, serum, qualitative     Reactive depression       Relevant Orders   TSH   T4, free     Screening for cervical cancer       Relevant Orders   Ambulatory referral to Obstetrics / Gynecology     Hemorrhoids, unspecified hemorrhoid type          Assessment and Plan Assessment & Plan Suspected uterine fibroid with abdominal mass and tenderness Abdominal mass with tenderness, suspected to be a uterine fibroid. The mass is hard and has been present for unknown time. Reports a knot in the lower abdomen causing discomfort when lying on that side and sometimes necessitating a bowel movement. - Order CT scan to evaluate the abdominal mass. - Perform blood work to check kidney function prior to CT scan. - Conduct urine pregnancy test to rule out pregnancy. However, she denies having female sexual partner or being pregnant.  - Refer to gynecologist for further evaluation and management.  Hemorrhoids Presence of hemorrhoid causing discomfort and difficulty sitting. Symptoms include firmness and tenderness, with no bleeding reported. The hemorrhoid is decreasing in size. Has been using Preparation H and lidocaine, but lidocaine caused  discomfort. Declines rectal exam.  - Continue application of Preparation H cream. - Advise warm baths for symptom relief. - Recommend stool softeners and increased hydration to prevent aggravation. - Use Miralax to maintain soft stools. - Follow up if symptoms do not improve.  Skin tag of nasal cavity Skin tag located inside the nasal cavity, causing discomfort. Desires removal. - Refer to dermatology for removal of the nasal skin tag.  Anemia Anemia with regular menstrual cycles. Periods are heavy for the first couple of days. Recent change from tampons to pads has improved cramping. Reports leg discoloration, which will be evaluated with future blood work. - Plan to perform blood work at a future preventive health visit to evaluate anemia.  Grief reaction Experiencing grief reaction following the loss of her brother. No current counseling or medication for depression. Reports poor sleep and decreased appetite. Considering therapy as a first step. Hesitant about medication and prefers to try therapy first. - Encourage scheduling an appointment with a therapist, such as Amber Guerra. - Discuss potential for medication if therapy alone is insufficient. - Monitor mental health and consider further intervention if needed.    I am having Amber Guerra maintain her naproxen  and rizatriptan .  No orders of the defined types were placed in this encounter.

## 2024-02-08 NOTE — Telephone Encounter (Signed)
 Attempted to call patient once more, was able to reach her. Advised she go straight to the ED, gave verbal understanding

## 2024-02-09 ENCOUNTER — Encounter: Payer: Self-pay | Admitting: Family Medicine

## 2024-02-09 ENCOUNTER — Telehealth: Payer: Self-pay | Admitting: Family Medicine

## 2024-02-09 NOTE — ED Notes (Signed)
 Opened chart to review time of infusion.

## 2024-02-09 NOTE — Progress Notes (Signed)
 Patient was in the ED for blood transfusion and advised of CT result and the need to schedule with OB/GYN right away. I placed the OB/GYN referral yesterday.

## 2024-02-09 NOTE — Telephone Encounter (Signed)
 Please send patient dismissal letter. I am unable to care for her going forward since she does not follow medical advice even in life-threatening situations.

## 2024-02-10 LAB — BPAM RBC
Blood Product Expiration Date: 202509152359
ISSUE DATE / TIME: 202508202133
ISSUE DATE / TIME: 202508210024
ISSUE DATE / TIME: 202509152359
Unit Type and Rh: 202509152359
Unit Type and Rh: 202509152359
Unit Type and Rh: 5100
Unit Type and Rh: 5100

## 2024-02-10 LAB — TYPE AND SCREEN
ABO/RH(D): O POS
Antibody Screen: NEGATIVE
Unit division: 0
Unit division: 0

## 2024-02-13 ENCOUNTER — Telehealth: Payer: Self-pay

## 2024-02-13 NOTE — Telephone Encounter (Signed)
 Spoke with patient - she is upset at the dismissal and feels like she is not being heard. She states that she did everything she was advised to do and she needs medical advice having just received two pints of blood. She would like a phone call from the nurse to advise her what she should be doing until she finds a new primary. Patient also asked for the contact information to patient experience - I provided the email.  Copied from CRM #[id number]. Topic: Appointments - Scheduling Inquiry for Clinic >> Feb 10, 2024 10:10 AM Burnard DEL wrote: Reason for CRM: Patient was dismissed form practice on 8/21/. She called in today to schedule an hospital follow up. Since she's dismissed I'm not able to schedule her,however stated in letter provider may  provide care for 30 days after dismissal. Could patient please be contacted to schedule hospital follow up?

## 2024-02-14 NOTE — Telephone Encounter (Signed)
 I called OBGYN office and pt cannot be seen any sooner. I did advise them of the severe anemia and fibroid found and that pt was needing to be seen sooner, unfortunately they are booked. They did place pt on the wait list.  How would you like to move forward?

## 2024-02-15 NOTE — Telephone Encounter (Signed)
 Pt now has sooner appt 9/19, per pcp that is better so no need to redo referral.   Vickie has advised if pt has any SOB or chest pains she needs to go to ER

## 2024-03-09 ENCOUNTER — Encounter: Admitting: Obstetrics and Gynecology

## 2024-04-02 ENCOUNTER — Encounter: Admitting: Obstetrics and Gynecology

## 2024-07-12 ENCOUNTER — Telehealth: Payer: Self-pay

## 2024-07-12 DIAGNOSIS — R1900 Intra-abdominal and pelvic swelling, mass and lump, unspecified site: Secondary | ICD-10-CM

## 2024-07-12 NOTE — Progress Notes (Unsigned)
 Complex Care Management Note Care Guide Note  07/12/2024 Name: Amber Guerra MRN: [id number] DOB: [birth year]   Complex Care Management Outreach Attempts: An unsuccessful telephone outreach was attempted today to offer the patient information about available complex care management services.  Follow Up Plan:  Additional outreach attempts will be made to offer the patient complex care management information and services.   Encounter Outcome:  No Answer  Dream[facility]  [facility], [facility] Assistant Direct Dial: [phone]  Fax: [phone]

## 2024-07-13 NOTE — Progress Notes (Signed)
 Thank you for this referral. The [facility] team receives referrals for patients who meet Complex Care Management program criteria: chronic conditions including heart failure, stroke, COPD, ESRD, Sickle Cell, Diabetes with complications, Mental/Behavioral Health diagnosis, substance abuse/misuse and whose Primary Care Provider is a [facility] provider or [employer] contracted building control surveyor in [place]).  This patient does not have a [facility] or [facility] Primary Care Provider. [facility] Population [facility] cannot directly provide Care Management services to patients who do not have a qualifying Primary Care Provider. Please call us  if you need further clarification at [phone].    Patient given [facility] Service line number [phone].  Dreama [facility]  [facility], [facility] Assistant Direct Dial: [phone]  Fax: [phone]

## 2024-09-25 ENCOUNTER — Ambulatory Visit: Admitting: Dermatology
